# Patient Record
Sex: Male | Born: 1975 | Race: White | Hispanic: No | Marital: Married | Smoking: Current every day smoker
Health system: Southern US, Community
[De-identification: ages and names within clinical notes are randomized; demographics above are authoritative.]

## PROBLEM LIST (undated history)

## (undated) DIAGNOSIS — F319 Bipolar disorder, unspecified: Secondary | ICD-10-CM

## (undated) DIAGNOSIS — F32A Depression, unspecified: Secondary | ICD-10-CM

## (undated) DIAGNOSIS — I10 Essential (primary) hypertension: Secondary | ICD-10-CM

## (undated) DIAGNOSIS — G4730 Sleep apnea, unspecified: Secondary | ICD-10-CM

## (undated) DIAGNOSIS — E119 Type 2 diabetes mellitus without complications: Secondary | ICD-10-CM

## (undated) DIAGNOSIS — G43909 Migraine, unspecified, not intractable, without status migrainosus: Secondary | ICD-10-CM

## (undated) DIAGNOSIS — F419 Anxiety disorder, unspecified: Secondary | ICD-10-CM

## (undated) HISTORY — PX: MULTIPLE TOOTH EXTRACTIONS: SHX2053

---

## 2001-08-19 ENCOUNTER — Emergency Department (HOSPITAL_COMMUNITY): Admission: EM | Admit: 2001-08-19 | Discharge: 2001-08-19 | Payer: Self-pay | Admitting: Emergency Medicine

## 2003-02-17 ENCOUNTER — Ambulatory Visit (HOSPITAL_COMMUNITY): Admission: RE | Admit: 2003-02-17 | Discharge: 2003-02-17 | Payer: Self-pay | Admitting: Internal Medicine

## 2006-08-01 ENCOUNTER — Ambulatory Visit (HOSPITAL_COMMUNITY): Payer: Self-pay | Admitting: Psychiatry

## 2006-08-22 ENCOUNTER — Ambulatory Visit (HOSPITAL_COMMUNITY): Payer: Self-pay | Admitting: Psychiatry

## 2006-09-04 ENCOUNTER — Ambulatory Visit (HOSPITAL_COMMUNITY): Payer: Self-pay | Admitting: Psychology

## 2006-09-21 ENCOUNTER — Ambulatory Visit (HOSPITAL_COMMUNITY): Payer: Self-pay | Admitting: Psychiatry

## 2006-09-26 ENCOUNTER — Ambulatory Visit (HOSPITAL_COMMUNITY): Payer: Self-pay | Admitting: Psychology

## 2006-10-18 ENCOUNTER — Ambulatory Visit (HOSPITAL_COMMUNITY): Payer: Self-pay | Admitting: Psychology

## 2006-11-21 ENCOUNTER — Ambulatory Visit (HOSPITAL_COMMUNITY): Payer: Self-pay | Admitting: Psychiatry

## 2007-02-15 ENCOUNTER — Ambulatory Visit (HOSPITAL_COMMUNITY): Payer: Self-pay | Admitting: Psychiatry

## 2007-03-05 ENCOUNTER — Ambulatory Visit (HOSPITAL_COMMUNITY): Payer: Self-pay | Admitting: Psychology

## 2007-04-12 ENCOUNTER — Ambulatory Visit (HOSPITAL_COMMUNITY): Payer: Self-pay | Admitting: Psychiatry

## 2007-05-03 ENCOUNTER — Ambulatory Visit (HOSPITAL_COMMUNITY): Payer: Self-pay | Admitting: Psychiatry

## 2007-05-15 ENCOUNTER — Ambulatory Visit (HOSPITAL_COMMUNITY): Admission: RE | Admit: 2007-05-15 | Discharge: 2007-05-15 | Payer: Self-pay | Admitting: Internal Medicine

## 2007-08-16 ENCOUNTER — Ambulatory Visit (HOSPITAL_COMMUNITY): Payer: Self-pay | Admitting: Psychiatry

## 2008-02-17 ENCOUNTER — Emergency Department (HOSPITAL_COMMUNITY): Admission: EM | Admit: 2008-02-17 | Discharge: 2008-02-17 | Payer: Self-pay | Admitting: Emergency Medicine

## 2008-03-05 ENCOUNTER — Emergency Department (HOSPITAL_COMMUNITY): Admission: EM | Admit: 2008-03-05 | Discharge: 2008-03-05 | Payer: Self-pay | Admitting: Emergency Medicine

## 2016-02-06 ENCOUNTER — Emergency Department (HOSPITAL_COMMUNITY)
Admission: EM | Admit: 2016-02-06 | Discharge: 2016-02-06 | Disposition: A | Discharge status: Home / Self Care | Payer: Self-pay | Attending: Emergency Medicine | Admitting: Emergency Medicine

## 2016-02-06 ENCOUNTER — Encounter (HOSPITAL_COMMUNITY): Payer: Self-pay | Admitting: Emergency Medicine

## 2016-02-06 DIAGNOSIS — L02214 Cutaneous abscess of groin: Secondary | ICD-10-CM | POA: Insufficient documentation

## 2016-02-06 DIAGNOSIS — L0291 Cutaneous abscess, unspecified: Secondary | ICD-10-CM

## 2016-02-06 DIAGNOSIS — L03314 Cellulitis of groin: Secondary | ICD-10-CM | POA: Insufficient documentation

## 2016-02-06 HISTORY — DX: Migraine, unspecified, not intractable, without status migrainosus: G43.909

## 2016-02-06 LAB — CBC WITH DIFFERENTIAL/PLATELET
Basophils Absolute: 0 10*3/uL (ref 0.0–0.1)
Basophils Relative: 0 %
Eosinophils Absolute: 0.3 10*3/uL (ref 0.0–0.7)
Eosinophils Relative: 2 %
HCT: 44.4 % (ref 39.0–52.0)
Hemoglobin: 15.4 g/dL (ref 13.0–17.0)
Lymphocytes Relative: 14 %
Lymphs Abs: 2.3 10*3/uL (ref 0.7–4.0)
MCH: 31.2 pg (ref 26.0–34.0)
MCHC: 34.7 g/dL (ref 30.0–36.0)
MCV: 89.9 fL (ref 78.0–100.0)
Monocytes Absolute: 1 10*3/uL (ref 0.1–1.0)
Monocytes Relative: 6 %
Neutro Abs: 12.6 10*3/uL — ABNORMAL HIGH (ref 1.7–7.7)
Neutrophils Relative %: 78 %
Platelets: 259 10*3/uL (ref 150–400)
RBC: 4.94 MIL/uL (ref 4.22–5.81)
RDW: 12.9 % (ref 11.5–15.5)
WBC: 16.2 10*3/uL — ABNORMAL HIGH (ref 4.0–10.5)

## 2016-02-06 LAB — BASIC METABOLIC PANEL
Anion gap: 11 (ref 5–15)
BUN: 13 mg/dL (ref 6–20)
CO2: 23 mmol/L (ref 22–32)
Calcium: 10 mg/dL (ref 8.9–10.3)
Chloride: 101 mmol/L (ref 101–111)
Creatinine, Ser: 1.06 mg/dL (ref 0.61–1.24)
GFR calc Af Amer: >60 mL/min (ref >60)
GFR calc non Af Amer: >60 mL/min (ref >60)
Glucose, Bld: 102 mg/dL — ABNORMAL HIGH (ref 65–99)
Potassium: 3.1 mmol/L — ABNORMAL LOW (ref 3.5–5.1)
Sodium: 135 mmol/L (ref 135–145)

## 2016-02-06 MED ORDER — ONDANSETRON HCL 4 MG/2ML IJ SOLN
4.0000 mg | Freq: Once | INTRAMUSCULAR | Status: AC
  Administered 2016-02-06: 4 mg via INTRAVENOUS
  Filled 2016-02-06: qty 2

## 2016-02-06 MED ORDER — CEPHALEXIN 500 MG PO CAPS: 500.0000 mg | ORAL_CAPSULE | Freq: Four times a day (QID) | ORAL | 0 refills | Status: DC

## 2016-02-06 MED ORDER — MORPHINE SULFATE (PF) 4 MG/ML IV SOLN
4.0000 mg | Freq: Once | INTRAVENOUS | Status: AC
Start: 2016-02-06 — End: 2016-02-06
  Administered 2016-02-06: 4 mg via INTRAVENOUS
  Filled 2016-02-06: qty 1

## 2016-02-06 MED ORDER — CLINDAMYCIN PHOSPHATE 600 MG/50ML IV SOLN
600.0000 mg | Freq: Once | INTRAVENOUS | Status: AC
  Administered 2016-02-06: 600 mg via INTRAVENOUS
  Filled 2016-02-06: qty 50

## 2016-02-06 MED ORDER — HYDROCODONE-ACETAMINOPHEN 5-325 MG PO TABS: 1.0000 | ORAL_TABLET | ORAL | 0 refills | Status: DC | PRN

## 2016-02-06 MED ORDER — SULFAMETHOXAZOLE-TRIMETHOPRIM 800-160 MG PO TABS: 1.0000 | ORAL_TABLET | Freq: Two times a day (BID) | ORAL | 0 refills | Status: AC

## 2016-02-06 NOTE — ED Provider Notes (Signed)
[facility]-EMERGENCY DEPT Provider Note   CSN: [id number] Arrival date & time: 02/06/16  1609     History   Chief Complaint No chief complaint on file.   HPI Leonard Wiggins is a 40 y.o. male presenting with left groin swelling, pain and redness.  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 Since this event he has had increased pain, swelling and now spreading redness involving his lower abdomen and left thigh.  He denies nausea, vomiting, abdominal pain, fevers, chills or myalgias.  He has found no alleviators and has had no treatment prior to arrival.  The history is provided by the patient and the spouse.    Past Medical History:  Diagnosis Date  . Migraines     There are no active problems to display for this patient.   History reviewed. No pertinent surgical history.     Home Medications    Prior to Admission medications   Medication Sig Start Date End Date Taking? Authorizing Provider  cephALEXin (KEFLEX) 500 MG capsule Take 1 capsule (500 mg total) by mouth 4 (four) times daily. 02/06/16   Burgess AmorJulie Olyvia Gopal, PA-C  HYDROcodone-acetaminophen (NORCO/VICODIN) 5-325 MG tablet Take 1 tablet by mouth every 4 (four) hours as needed. 02/06/16   Burgess AmorJulie Carmalita Wakefield, PA-C  sulfamethoxazole-trimethoprim (BACTRIM DS,SEPTRA DS) 800-160 MG tablet Take 1 tablet by mouth 2 (two) times daily. 02/06/16 02/13/16  Burgess AmorJulie Esteban Kobashigawa, PA-C    Family History No family history on file.  Social History Social History  Substance Use Topics  . Smoking status: Not on file  . Smokeless tobacco: Not on file  . Alcohol use Not on file     Allergies   Patient has no allergy information on record.   Review of Systems Review of Systems  Constitutional: Negative for fever.  HENT: Negative for congestion and sore throat.   Eyes: Negative.   Respiratory: Negative for chest tightness and shortness of breath.   Cardiovascular:  Negative for chest pain.  Gastrointestinal: Negative for abdominal pain and nausea.  Genitourinary: Negative.   Musculoskeletal: Negative for arthralgias, joint swelling and neck pain.  Skin: Positive for color change and wound. Negative for rash.  Neurological: Negative for dizziness, weakness, light-headedness, numbness and headaches.  Psychiatric/Behavioral: Negative.      Physical Exam Updated Vital Signs BP 126/65 (BP Location: Right Arm)   Pulse 100   Temp 98.9 F (37.2 C) (Oral)   Resp 18   Ht 5\' 10"  (1.778 m)   Wt 81.6 kg   SpO2 99%   BMI 25.83 kg/m   Physical Exam  Constitutional: He appears well-developed and well-nourished.  HENT:  Head: Normocephalic and atraumatic.  Eyes: Conjunctivae are normal.  Neck: Normal range of motion.  Cardiovascular: Normal rate, regular rhythm, normal heart sounds and intact distal pulses.   Pulmonary/Chest: Effort normal and breath sounds normal. He has no wheezes.  Abdominal: Soft. Bowel sounds are normal. There is no tenderness.  Musculoskeletal: Normal range of motion.  Neurological: He is alert.  Skin: Skin is warm and dry. There is erythema.  Induration left groin and inguinal space/upper scrotum.  Does not involve the majority of the scrotal sack.  No drainage.  Surrounding erythema of left medial thigh and suprapubic area measuring 12 x 24 cm,  Sparing the groin crease.  No red streaking.  No perineum involvement.  Psychiatric: He has a normal  mood and affect.  Nursing note and vitals reviewed.    ED Treatments / Results  Labs (all labs ordered are listed, but only abnormal results are displayed) Labs Reviewed  CBC WITH DIFFERENTIAL/PLATELET - Abnormal; Notable for the following:       Result Value   WBC 16.2 (*)    Neutro Abs 12.6 (*)    All other components within normal limits  BASIC METABOLIC PANEL - Abnormal; Notable for the following:    Potassium 3.1 (*)    Glucose, Bld 102 (*)    All other components within  normal limits  AEROBIC/ANAEROBIC CULTURE (SURGICAL/DEEP WOUND)    EKG  EKG Interpretation None       Radiology No results found.  Procedures Procedures (including critical care time)  Apiration of blood/fluid Performed by: Burgess AmorIDOL, Grisela Mesch Consent obtained. Required items: required blood products, implants, devices, and special equipment available Patient identity confirmed: verbally with patient Time out: Immediately prior to procedure a "time out" was called to verify the correct patient, procedure, equipment, support staff and site/side marked as required. Preparation: Patient was prepped and draped in the usual sterile fashion. Patient tolerance: Patient tolerated the procedure well with no immediate complications.  Location of aspiration: left groin/scrotum.  Sterile technique used with betadine,  Freeze spray applied, 0.5 cc purulent/bloody aspirate obtained.       Medications Ordered in ED Medications  morphine 4 MG/ML injection 4 mg (4 mg Intravenous Given 02/06/16 1727)  ondansetron (ZOFRAN) injection 4 mg (4 mg Intravenous Given 02/06/16 1727)  clindamycin (CLEOCIN) IVPB 600 mg (0 mg Intravenous Stopped 02/06/16 1904)     Initial Impression / Assessment and Plan / ED Course  I have reviewed the triage vital signs and the nursing notes.  Pertinent labs & imaging results that were available during my care of the patient were reviewed by me and considered in my medical decision making (see chart for details).  Clinical Course     Abscess culture sent.  Labs reviewed and discussed with pt.  Leukocytosis.  Advised admission which pt refused.  He does agree to return tomorrow for a recheck.  IV clindamycin given, prescribed bactrim and keflex for home as he has no insurance, concern about affording clindamycin.  Hydrocodone.  Warm compresses.  Recheck here tomorrow.  Cellulitis area was outlined using skin marker.  Final Clinical Impressions(s) / ED Diagnoses    Final diagnoses:  Cellulitis of groin, left  Abscess    New Prescriptions Discharge Medication List as of 02/06/2016  6:59 PM    START taking these medications   Details  cephALEXin (KEFLEX) 500 MG capsule Take 1 capsule (500 mg total) by mouth 4 (four) times daily., Starting Sat 02/06/2016, Print    sulfamethoxazole-trimethoprim (BACTRIM DS,SEPTRA DS) 800-160 MG tablet Take 1 tablet by mouth 2 (two) times daily., Starting Sat 02/06/2016, Until Sat 02/13/2016, Print         Burgess AmorJulie Barron Vanloan, PA-C 02/06/16 1909    Samuel JesterKathleen McManus, DO 02/07/16 2215

## 2016-02-06 NOTE — ED Provider Notes (Signed)
EMERGENCY DEPARTMENT US SOFT TISSUE INTERPRETATION "Study: Limited Soft Tissue Ultrasound"  INDICATIONS: Pain and Soft tissue infection Multiple views of the body part were obtained in real-time with a multi-frequency linear probe PERFORMED BY:  Myself IMAGES ARCHIVED?: Yes SIDE:Left BODY PART:Pelvic wall FINDINGS: Abcess present and Cellulitis present INTERPRETATION:  Abcess present and Cellulitis present   CPT: Pelvic wall 40981-1976857-26   Marily MemosJason Paris [place], MD 02/06/16 1726

## 2016-02-06 NOTE — ED Triage Notes (Signed)
Pt states that he noticed the place on Thursday.  [place] [place] and thinks that he was bitten by a spider.  Pt states that he has an abscess on the left groin that red, swollen, and tender to the touch.

## 2016-02-06 NOTE — Discharge Instructions (Signed)
Take both antibiotics before bed tonight as your next doses.  You may take the hydrocodone for pain relief if needed - this will make you drowsy.  Do not drive within 4 hours of taking this medicine.  You may try a warm tub soak or warm compresses to the site also.  You are leaving against our advise as discussed as you would benefit from further IV antibiotics.  It is very important that you return here tomorrow for a recheck.

## 2016-02-06 NOTE — ED Notes (Signed)
MD at bedside. 

## 2016-02-13 LAB — AEROBIC/ANAEROBIC CULTURE (SURGICAL/DEEP WOUND): Special Requests: NORMAL

## 2016-02-13 LAB — AEROBIC/ANAEROBIC CULTURE W GRAM STAIN (SURGICAL/DEEP WOUND): Gram Stain: NONE SEEN

## 2016-02-14 ENCOUNTER — Telehealth (HOSPITAL_BASED_OUTPATIENT_CLINIC_OR_DEPARTMENT_OTHER): Payer: Self-pay

## 2016-02-14 NOTE — Telephone Encounter (Signed)
Post ED Visit - Positive Culture Follow-up  Culture report reviewed by antimicrobial stewardship pharmacist:  []  Enzo BiNathan Batchelder, Pharm.D. []  Celedonio MiyamotoJeremy Frens, Pharm.D., BCPS []  Garvin FilaMike Maccia, Pharm.D. []  Georgina PillionElizabeth Martin, Pharm.D., BCPS []  NiagaraMinh Pham, VermontPharm.D., BCPS, AAHIVP []  Estella HuskMichelle Turner, Pharm.D., BCPS, AAHIVP []  Tennis Mustassie Stewart, Pharm.D. []  Sherle Poeob Vincent, [address] Rainbow BoulevardPharm.D. [employer]ts[employer] Corporationllison masters Pharm D Positive Aerobic.Anaerobic culture Treated with Sulfamethoxazole-Trimethoprim, organism sensitive to the same and no further patient follow-up is required at this time.  Jerry CarasCullom, Setsuko Robins Burnett 02/14/2016, 10:02 AM

## 2021-08-12 ENCOUNTER — Encounter (HOSPITAL_COMMUNITY): Payer: Self-pay | Admitting: Emergency Medicine

## 2021-08-12 ENCOUNTER — Emergency Department (HOSPITAL_COMMUNITY)
Admission: EM | Admit: 2021-08-12 | Discharge: 2021-08-13 | Disposition: A | Discharge status: Home / Self Care | Payer: Medicaid Other | Attending: Emergency Medicine | Admitting: Emergency Medicine

## 2021-08-12 DIAGNOSIS — K2900 Acute gastritis without bleeding: Secondary | ICD-10-CM | POA: Insufficient documentation

## 2021-08-12 DIAGNOSIS — R112 Nausea with vomiting, unspecified: Secondary | ICD-10-CM | POA: Diagnosis present

## 2021-08-12 DIAGNOSIS — R197 Diarrhea, unspecified: Secondary | ICD-10-CM

## 2021-08-12 DIAGNOSIS — R1084 Generalized abdominal pain: Secondary | ICD-10-CM

## 2021-08-12 NOTE — ED Triage Notes (Signed)
Pt c/o N/V/D since Saturday. Pt seen at [facility] and prescribed 2 medications with no improvement.

## 2021-08-13 ENCOUNTER — Emergency Department (HOSPITAL_COMMUNITY): Payer: Medicaid Other

## 2021-08-13 LAB — COMPREHENSIVE METABOLIC PANEL
ALT: 27 U/L (ref 0–44)
AST: 23 U/L (ref 15–41)
Albumin: 4 g/dL (ref 3.5–5.0)
Alkaline Phosphatase: 108 U/L (ref 38–126)
Anion gap: 10 (ref 5–15)
BUN: 9 mg/dL (ref 6–20)
CO2: 18 mmol/L — ABNORMAL LOW (ref 22–32)
Calcium: 9.3 mg/dL (ref 8.9–10.3)
Chloride: 107 mmol/L (ref 98–111)
Creatinine, Ser: 1.24 mg/dL (ref 0.61–1.24)
GFR, Estimated: >60 mL/min (ref >60)
Glucose, Bld: 139 mg/dL — ABNORMAL HIGH (ref 70–99)
Potassium: 4 mmol/L (ref 3.5–5.1)
Sodium: 135 mmol/L (ref 135–145)
Total Bilirubin: 0.6 mg/dL (ref 0.3–1.2)
Total Protein: 7.4 g/dL (ref 6.5–8.1)

## 2021-08-13 LAB — CBC
HCT: 61.8 % — ABNORMAL HIGH (ref 39.0–52.0)
Hemoglobin: 19.8 g/dL — ABNORMAL HIGH (ref 13.0–17.0)
MCH: 30.7 pg (ref 26.0–34.0)
MCHC: 32 g/dL (ref 30.0–36.0)
MCV: 95.7 fL (ref 80.0–100.0)
Platelets: 125 10*3/uL — ABNORMAL LOW (ref 150–400)
RBC: 6.46 MIL/uL — ABNORMAL HIGH (ref 4.22–5.81)
RDW: 14.6 % (ref 11.5–15.5)
WBC: 16.1 10*3/uL — ABNORMAL HIGH (ref 4.0–10.5)
nRBC: 0 % (ref 0.0–0.2)

## 2021-08-13 LAB — LIPASE, BLOOD: Lipase: 19 U/L (ref 11–51)

## 2021-08-13 MED ORDER — ONDANSETRON HCL 4 MG/2ML IJ SOLN
4.0000 mg | Freq: Once | INTRAMUSCULAR | Status: AC
  Administered 2021-08-13: 4 mg via INTRAVENOUS
  Filled 2021-08-13: qty 2

## 2021-08-13 MED ORDER — ONDANSETRON 4 MG PO TBDP: ORAL_TABLET | ORAL | 0 refills | Status: AC

## 2021-08-13 MED ORDER — LOPERAMIDE HCL 2 MG PO CAPS: 2.0000 mg | ORAL_CAPSULE | Freq: Four times a day (QID) | ORAL | 0 refills | Status: AC | PRN

## 2021-08-13 MED ORDER — PANTOPRAZOLE SODIUM 40 MG PO TBEC: 40.0000 mg | DELAYED_RELEASE_TABLET | Freq: Every day | ORAL | 3 refills | Status: DC

## 2021-08-13 MED ORDER — SUCRALFATE 1 GM/10ML PO SUSP: 1.0000 g | Freq: Three times a day (TID) | ORAL | 0 refills | Status: AC

## 2021-08-13 MED ORDER — IOHEXOL 300 MG/ML  SOLN
100.0000 mL | Freq: Once | INTRAMUSCULAR | Status: AC | PRN
  Administered 2021-08-13: 100 mL via INTRAVENOUS

## 2021-08-13 MED ORDER — SODIUM CHLORIDE 0.9 % IV BOLUS
1000.0000 mL | Freq: Once | INTRAVENOUS | Status: AC
  Administered 2021-08-13: 1000 mL via INTRAVENOUS

## 2021-08-13 NOTE — ED Notes (Signed)
Patient transported to CT 

## 2021-08-13 NOTE — ED Provider Notes (Signed)
[facility] EMERGENCY DEPARTMENT Provider Note   CSN: [id number] Arrival date & time: 08/12/21  2302     History  Chief Complaint  Patient presents with   Emesis   Diarrhea    Leonard Wiggins is a 46 y.o. male.  Patient presents to the emergency department for evaluation of nausea, vomiting, diarrhea.  Patient reports that symptoms have been present for several days.  He also reports that this seems to happen on a somewhat regular schedule every 1 to 1-1/2 months.  He has been thinking it is related to something that he eats.  Patient reports diffuse upper abdominal pain associated with the symptoms.  He reports that he has a "sour stomach" with lots of foul-smelling belches.  He was seen at urgent care 4 days ago and was given prescription for his symptoms, but he ran out of the prescriptions and is still symptomatic.      Home Medications Prior to Admission medications   Medication Sig Start Date End Date Taking? Authorizing Provider  loperamide (IMODIUM) 2 MG capsule Take 1 capsule (2 mg total) by mouth 4 (four) times daily as needed for diarrhea or loose stools. 08/13/21  Yes Jazsmine Macari, Canary Brim, MD  ondansetron (ZOFRAN-ODT) 4 MG disintegrating tablet 4mg  ODT q4 hours prn nausea/vomit 08/13/21  Yes Tashon Capp, 08/15/21, MD  pantoprazole (PROTONIX) 40 MG tablet Take 1 tablet (40 mg total) by mouth daily. 08/13/21  Yes Shahida Schnackenberg, 08/15/21, MD  sucralfate (CARAFATE) 1 GM/10ML suspension Take 10 mLs (1 g total) by mouth 4 (four) times daily -  with meals and at bedtime. 08/13/21  Yes Amy Gothard, 08/15/21, MD  cephALEXin (KEFLEX) 500 MG capsule Take 1 capsule (500 mg total) by mouth 4 (four) times daily. 02/06/16   13/11/17, PA-C  HYDROcodone-acetaminophen (NORCO/VICODIN) 5-325 MG tablet Take 1 tablet by mouth every 4 (four) hours as needed. 02/06/16   13/11/17, PA-C      Allergies    Patient has no known allergies.    Review of Systems   Review of Systems  Physical  Exam Updated Vital Signs BP 120/87   Pulse (!) 125   Temp 98.1 F (36.7 C) (Oral)   Resp 18   Ht 6' (1.829 m)   Wt 95.3 kg   SpO2 97%   BMI 28.48 kg/m  Physical Exam Vitals and nursing note reviewed.  Constitutional:      General: He is not in acute distress.    Appearance: He is well-developed.  HENT:     Head: Normocephalic and atraumatic.     Mouth/Throat:     Mouth: Mucous membranes are moist.  Eyes:     General: Vision grossly intact. Gaze aligned appropriately.     Extraocular Movements: Extraocular movements intact.     Conjunctiva/sclera: Conjunctivae normal.  Cardiovascular:     Rate and Rhythm: Normal rate and regular rhythm.     Pulses: Normal pulses.     Heart sounds: Normal heart sounds, S1 normal and S2 normal. No murmur heard.   No friction rub. No gallop.  Pulmonary:     Effort: Pulmonary effort is normal. No respiratory distress.     Breath sounds: Normal breath sounds.  Abdominal:     Palpations: Abdomen is soft.     Tenderness: There is abdominal tenderness in the epigastric area. There is no guarding or rebound.     Hernia: No hernia is present.  Musculoskeletal:        General: No  swelling.     Cervical back: Full passive range of motion without pain, normal range of motion and neck supple. No pain with movement, spinous process tenderness or muscular tenderness. Normal range of motion.     Right lower leg: No edema.     Left lower leg: No edema.  Skin:    General: Skin is warm and dry.     Capillary Refill: Capillary refill takes less than 2 seconds.     Findings: No ecchymosis, erythema, lesion or wound.  Neurological:     Mental Status: He is alert and oriented to person, place, and time.     GCS: GCS eye subscore is 4. GCS verbal subscore is 5. GCS motor subscore is 6.     Cranial Nerves: Cranial nerves 2-12 are intact.     Sensory: Sensation is intact.     Motor: Motor function is intact. No weakness or abnormal muscle tone.      Coordination: Coordination is intact.  Psychiatric:        Mood and Affect: Mood normal.        Speech: Speech normal.        Behavior: Behavior normal.    ED Results / Procedures / Treatments   Labs (all labs ordered are listed, but only abnormal results are displayed) Labs Reviewed  COMPREHENSIVE METABOLIC PANEL - Abnormal; Notable for the following components:      Result Value   CO2 18 (*)    Glucose, Bld 139 (*)    All other components within normal limits  CBC - Abnormal; Notable for the following components:   WBC 16.1 (*)    RBC 6.46 (*)    Hemoglobin 19.8 (*)    HCT 61.8 (*)    Platelets 125 (*)    All other components within normal limits  LIPASE, BLOOD  URINALYSIS, ROUTINE W REFLEX MICROSCOPIC    EKG None  Radiology CT ABDOMEN PELVIS W CONTRAST  Result Date: 08/13/2021 CLINICAL DATA:  Abdominal pain, nausea/vomiting EXAM: CT ABDOMEN AND PELVIS WITH CONTRAST TECHNIQUE: Multidetector CT imaging of the abdomen and pelvis was performed using the standard protocol following bolus administration of intravenous contrast. RADIATION DOSE REDUCTION: This exam was performed according to the departmental dose-optimization program which includes automated exposure control, adjustment of the mA and/or kV according to patient size and/or use of iterative reconstruction technique. CONTRAST:  100mL OMNIPAQUE IOHEXOL 300 MG/ML  SOLN COMPARISON:  None Available. FINDINGS: Lower chest: Lung bases are clear. Hepatobiliary: 14 mm cyst versus hemangioma in the posterior right hepatic lobe (series 2/image 28). Additional subcentimeter probable cyst (series 2/image 47). Gallbladder is unremarkable. No intrahepatic or extrahepatic ductal dilatation. Pancreas: Within normal limits. Spleen: Within normal limits. Adrenals/Urinary Tract: Subcentimeter right adrenal cyst (series 2/image 23), benign. No follow-up imaging is recommended. Left adrenal gland is within normal limits. 2.4 cm left lower pole  renal cyst, benign. Right kidney is within normal limits. No hydronephrosis. Bladder is underdistended but unremarkable. Stomach/Bowel: Stomach is within normal limits. No evidence of bowel obstruction. Normal appendix (series 2/image 62). No colonic wall thickening or inflammatory changes. Vascular/Lymphatic: No evidence of abdominal aortic aneurysm. Mild atherosclerotic calcifications the abdominal aorta. No suspicious abdominopelvic lymphadenopathy. Reproductive: Prostate is unremarkable, noting dystrophic calcifications. Other: No abdominopelvic ascites. Musculoskeletal: Visualized osseous structures are within normal limits. IMPRESSION: No evidence of bowel obstruction. Normal appendix. No CT findings to account for the patient's abdominal pain. Additional ancillary findings as above. Electronically Signed   By: Charline BillsSriyesh  Krishnan  M.D.   On: 08/13/2021 01:24    Procedures Procedures    Medications Ordered in ED Medications  sodium chloride 0.9 % bolus 1,000 mL (1,000 mLs Intravenous New Bag/Given 08/13/21 0040)  ondansetron (ZOFRAN) injection 4 mg (4 mg Intravenous Given 08/13/21 0039)  iohexol (OMNIPAQUE) 300 MG/ML solution 100 mL (100 mLs Intravenous Contrast Given 08/13/21 0110)    ED Course/ Medical Decision Making/ A&P                           Medical Decision Making Problems Addressed: Acute gastritis without hemorrhage, unspecified gastritis type: acute illness or injury Generalized abdominal pain: acute illness or injury Nausea vomiting and diarrhea: acute illness or injury with systemic symptoms    Details: Dehydration resulting in tachycardia  Amount and/or Complexity of Data Reviewed Labs: ordered. Decision-making details documented in ED Course. Radiology: ordered and independent interpretation performed. Decision-making details documented in ED Course.  Risk Prescription drug management.   Presents to the emergency department for evaluation of abdominal pain.  Patient  has had nausea, vomiting and diarrhea.  He reports that this has been a recurrent problem recently.  Differential diagnosis considered includes GERD, gastritis, peptic ulcer disease, cholecystitis, pancreatitis, colitis, small bowel obstruction.  Examination reveals tachycardia.  Patient appears uncomfortable and is likely mildly dehydrated.  Administered IV fluids.  Lab work reveals a leukocytosis, remainder of blood work is unremarkable.  Normal lipase, doubt pancreatitis.  LFTs are normal.  No anemia to suggest significant GI bleeding.  Patient underwent CT scan that did not show any acute pathology.  We will treat patient symptomatically, add PPI, Carafate.  Patient will require follow-up with gastroenterology.  His vitals are stabilized and lab work relatively normal.  He does not require hospitalization at this time.        Final Clinical Impression(s) / ED Diagnoses Final diagnoses:  Nausea vomiting and diarrhea  Generalized abdominal pain  Acute gastritis without hemorrhage, unspecified gastritis type    Rx / DC Orders ED Discharge Orders          Ordered    pantoprazole (PROTONIX) 40 MG tablet  Daily        08/13/21 0135    sucralfate (CARAFATE) 1 GM/10ML suspension  3 times daily with meals & bedtime        08/13/21 0135    ondansetron (ZOFRAN-ODT) 4 MG disintegrating tablet        08/13/21 0135    loperamide (IMODIUM) 2 MG capsule  4 times daily PRN        08/13/21 0135              Gilda Crease, MD 08/13/21 0140

## 2021-10-03 ENCOUNTER — Encounter (HOSPITAL_COMMUNITY): Payer: Self-pay

## 2021-10-03 ENCOUNTER — Emergency Department (HOSPITAL_COMMUNITY)
Admission: EM | Admit: 2021-10-03 | Discharge: 2021-10-03 | Disposition: A | Discharge status: Home / Self Care | Payer: Medicaid Other | Attending: Emergency Medicine | Admitting: Emergency Medicine

## 2021-10-03 ENCOUNTER — Emergency Department (HOSPITAL_COMMUNITY): Payer: Medicaid Other

## 2021-10-03 ENCOUNTER — Other Ambulatory Visit: Payer: Self-pay

## 2021-10-03 DIAGNOSIS — R1013 Epigastric pain: Secondary | ICD-10-CM | POA: Diagnosis not present

## 2021-10-03 DIAGNOSIS — Z79899 Other long term (current) drug therapy: Secondary | ICD-10-CM | POA: Insufficient documentation

## 2021-10-03 DIAGNOSIS — R112 Nausea with vomiting, unspecified: Secondary | ICD-10-CM | POA: Diagnosis not present

## 2021-10-03 DIAGNOSIS — R079 Chest pain, unspecified: Secondary | ICD-10-CM | POA: Diagnosis present

## 2021-10-03 LAB — TROPONIN I (HIGH SENSITIVITY)
Troponin I (High Sensitivity): 2 ng/L (ref <18)
Troponin I (High Sensitivity): 2 ng/L (ref <18)

## 2021-10-03 LAB — CBC
HCT: 49.5 % (ref 39.0–52.0)
Hemoglobin: 16.6 g/dL (ref 13.0–17.0)
MCH: 30 pg (ref 26.0–34.0)
MCHC: 33.5 g/dL (ref 30.0–36.0)
MCV: 89.5 fL (ref 80.0–100.0)
Platelets: 312 10*3/uL (ref 150–400)
RBC: 5.53 MIL/uL (ref 4.22–5.81)
RDW: 13 % (ref 11.5–15.5)
WBC: 14 10*3/uL — ABNORMAL HIGH (ref 4.0–10.5)
nRBC: 0 % (ref 0.0–0.2)

## 2021-10-03 LAB — HEPATIC FUNCTION PANEL
ALT: 30 U/L (ref 0–44)
AST: 26 U/L (ref 15–41)
Albumin: 4.7 g/dL (ref 3.5–5.0)
Alkaline Phosphatase: 78 U/L (ref 38–126)
Bilirubin, Direct: 0.1 mg/dL (ref 0.0–0.2)
Indirect Bilirubin: 0.8 mg/dL (ref 0.3–0.9)
Total Bilirubin: 0.9 mg/dL (ref 0.3–1.2)
Total Protein: 8.2 g/dL — ABNORMAL HIGH (ref 6.5–8.1)

## 2021-10-03 LAB — BASIC METABOLIC PANEL
Anion gap: 10 (ref 5–15)
BUN: 11 mg/dL (ref 6–20)
CO2: 25 mmol/L (ref 22–32)
Calcium: 9.8 mg/dL (ref 8.9–10.3)
Chloride: 107 mmol/L (ref 98–111)
Creatinine, Ser: 1.01 mg/dL (ref 0.61–1.24)
GFR, Estimated: >60 mL/min (ref >60)
Glucose, Bld: 118 mg/dL — ABNORMAL HIGH (ref 70–99)
Potassium: 3.2 mmol/L — ABNORMAL LOW (ref 3.5–5.1)
Sodium: 142 mmol/L (ref 135–145)

## 2021-10-03 LAB — MAGNESIUM: Magnesium: 2 mg/dL (ref 1.7–2.4)

## 2021-10-03 LAB — CBG MONITORING, ED: Glucose-Capillary: 120 mg/dL — ABNORMAL HIGH (ref 70–99)

## 2021-10-03 LAB — LIPASE, BLOOD: Lipase: 24 U/L (ref 11–51)

## 2021-10-03 MED ORDER — ONDANSETRON HCL 4 MG/2ML IJ SOLN
4.0000 mg | Freq: Once | INTRAMUSCULAR | Status: AC
  Administered 2021-10-03: 4 mg via INTRAVENOUS
  Filled 2021-10-03: qty 2

## 2021-10-03 MED ORDER — IOHEXOL 350 MG/ML SOLN
100.0000 mL | Freq: Once | INTRAVENOUS | Status: AC | PRN
Start: 2021-10-03 — End: 2021-10-03
  Administered 2021-10-03: 100 mL via INTRAVENOUS

## 2021-10-03 MED ORDER — FAMOTIDINE IN NACL 20-0.9 MG/50ML-% IV SOLN
20.0000 mg | Freq: Once | INTRAVENOUS | Status: AC
  Administered 2021-10-03: 20 mg via INTRAVENOUS
  Filled 2021-10-03: qty 50

## 2021-10-03 MED ORDER — ALUM & MAG HYDROXIDE-SIMETH 200-200-20 MG/5ML PO SUSP
30.0000 mL | Freq: Once | ORAL | Status: AC
Start: 2021-10-03 — End: 2021-10-03
  Administered 2021-10-03: 30 mL via ORAL
  Filled 2021-10-03: qty 30

## 2021-10-03 MED ORDER — LACTATED RINGERS IV BOLUS
1000.0000 mL | Freq: Once | INTRAVENOUS | Status: AC
Start: 2021-10-03 — End: 2021-10-03
  Administered 2021-10-03: 1000 mL via INTRAVENOUS

## 2021-10-03 MED ORDER — POTASSIUM CHLORIDE 20 MEQ PO PACK
40.0000 meq | PACK | Freq: Once | ORAL | Status: AC
  Administered 2021-10-03: 40 meq via ORAL
  Filled 2021-10-03: qty 2

## 2021-10-03 MED ORDER — HYDROMORPHONE HCL 1 MG/ML IJ SOLN
1.0000 mg | Freq: Once | INTRAMUSCULAR | Status: AC
  Administered 2021-10-03: 1 mg via INTRAVENOUS
  Filled 2021-10-03: qty 1

## 2021-10-03 MED ORDER — LIDOCAINE VISCOUS HCL 2 % MT SOLN
15.0000 mL | Freq: Once | OROMUCOSAL | Status: AC
  Administered 2021-10-03: 15 mL via ORAL
  Filled 2021-10-03: qty 15

## 2021-10-03 NOTE — Discharge Instructions (Signed)
Keep your primary care doctor appointment for Tuesday.  When you see your primary care doctor, discuss long-term medications to help with your stomach upset and reflux.  Also discuss referral for a gastroenterologist for possible endoscopy of your esophagus and stomach.  Return to the emergency department at any time for any recurrence of severe symptoms.

## 2021-10-03 NOTE — ED Notes (Signed)
ED Provider at bedside. 

## 2021-10-03 NOTE — ED Notes (Signed)
Patient is more relaxed in bed and is no longer diaphoretic. Patient states he is starting to feel much better.

## 2021-10-03 NOTE — ED Triage Notes (Signed)
Pt presents to ED with complaints of mid chest pain, nausea, vomiting and diaphoresis since Friday.

## 2021-10-03 NOTE — ED Provider Notes (Signed)
[facility] EMERGENCY DEPARTMENT Provider Note   CSN: [id number] Arrival date & time: 10/03/21  1240     History  Chief Complaint  Patient presents with   Chest Pain    Leonard Wiggins is a 46 y.o. male.  HPI Patient presents for pain in chest, epigastrium, nausea, vomiting, diaphoresis.  Medical history includes recurrent episodes of similar symptoms.  Current episode of symptoms began 2 days ago.  Patient denies any associated shortness of breath.  Episodes of diaphoresis seem to occur with worsening of pain.  Pain is worsened with eating any foods.  Because of this, he has not eaten in the past 2 days.  He has been able to tolerate fluids.  Patient endorses current pain and lateral aspects of chest as well as epigastrium.  He also endorses current nausea.  He states he has not taken any antiemetics at home.  In the past, he has been prescribed Protonix and Carafate.  He is not taking these.  He has recently been using over-the-counter Pepcid.  He has no known cardiac history.  He does not drink alcohol.    Home Medications Prior to Admission medications   Medication Sig Start Date End Date Taking? Authorizing Provider  busPIRone (BUSPAR) 30 MG tablet Take 30 mg by mouth 2 (two) times daily.   Yes [provider]  citalopram (CELEXA) 40 MG tablet Take 40 mg by mouth daily.   Yes [provider]  clonazePAM (KLONOPIN) 1 MG tablet Take 1 mg by mouth 2 (two) times daily.   Yes [provider]  cloNIDine (CATAPRES) 0.2 MG tablet Take 0.2 mg by mouth 2 (two) times daily. 09/02/21  Yes [provider]  gabapentin (NEURONTIN) 400 MG capsule Take 400 mg by mouth 3 (three) times daily.   Yes [provider]  glipiZIDE (GLUCOTROL XL) 2.5 MG 24 hr tablet Take 2.5 mg by mouth daily with breakfast.   Yes [provider]  lamoTRIgine (LAMICTAL) 150 MG tablet Take 150 mg by mouth daily.   Yes [provider]  lisinopril (ZESTRIL) 5 MG tablet  Take 5 mg by mouth daily.   Yes [provider]  pantoprazole (PROTONIX) 40 MG tablet Take 1 tablet (40 mg total) by mouth daily. 08/13/21  Yes Pollina, Canary Brim, MD  loperamide (IMODIUM) 2 MG capsule Take 1 capsule (2 mg total) by mouth 4 (four) times daily as needed for diarrhea or loose stools. Patient not taking: Reported on 10/03/2021 08/13/21   Gilda Crease, MD  ondansetron (ZOFRAN-ODT) 4 MG disintegrating tablet 4mg  ODT q4 hours prn nausea/vomit Patient not taking: Reported on 10/03/2021 08/13/21   08/15/21, MD  sucralfate (CARAFATE) 1 GM/10ML suspension Take 10 mLs (1 g total) by mouth 4 (four) times daily -  with meals and at bedtime. Patient not taking: Reported on 10/03/2021 08/13/21   08/15/21, MD      Allergies    Patient has no known allergies.    Review of Systems   Review of Systems  Constitutional:  Positive for appetite change and diaphoresis.  Cardiovascular:  Positive for chest pain.  Gastrointestinal:  Positive for abdominal pain, nausea and vomiting.  All other systems reviewed and are negative.   Physical Exam Updated Vital Signs BP (!) 156/92   Pulse 91   Temp 98.9 F (37.2 C) (Oral)   Resp 16   Ht 6' (1.829 m)   Wt 99.8 kg   SpO2 98%   BMI  29.84 kg/m  Physical Exam Vitals and nursing note reviewed.  Constitutional:      General: He is not in acute distress.    Appearance: He is well-developed. He is ill-appearing. He is not toxic-appearing or diaphoretic.  HENT:     Head: Normocephalic and atraumatic.  Eyes:     Conjunctiva/sclera: Conjunctivae normal.  Neck:     Vascular: No JVD.  Cardiovascular:     Rate and Rhythm: Normal rate and regular rhythm.     Heart sounds: No murmur heard. Pulmonary:     Effort: Pulmonary effort is normal. Tachypnea present. No respiratory distress.     Breath sounds: Normal breath sounds. No decreased breath sounds, wheezing, rhonchi or rales.  Chest:     Chest wall:  Tenderness present.  Abdominal:     Palpations: Abdomen is soft.     Tenderness: There is abdominal tenderness.  Musculoskeletal:        General: No swelling.     Cervical back: Normal range of motion and neck supple.     Right lower leg: No edema.     Left lower leg: No edema.  Skin:    General: Skin is warm and dry.     Coloration: Skin is not cyanotic or pale.  Neurological:     General: No focal deficit present.     Mental Status: He is alert and oriented to person, place, and time.  Psychiatric:        Mood and Affect: Mood normal.        Behavior: Behavior normal.     ED Results / Procedures / Treatments   Labs (all labs ordered are listed, but only abnormal results are displayed) Labs Reviewed  BASIC METABOLIC PANEL - Abnormal; Notable for the following components:      Result Value   Potassium 3.2 (*)    Glucose, Bld 118 (*)    All other components within normal limits  CBC - Abnormal; Notable for the following components:   WBC 14.0 (*)    All other components within normal limits  HEPATIC FUNCTION PANEL - Abnormal; Notable for the following components:   Total Protein 8.2 (*)    All other components within normal limits  CBG MONITORING, ED - Abnormal; Notable for the following components:   Glucose-Capillary 120 (*)    All other components within normal limits  LIPASE, BLOOD  MAGNESIUM  TROPONIN I (HIGH SENSITIVITY)  TROPONIN I (HIGH SENSITIVITY)    EKG EKG Interpretation  Date/Time:  Sunday October 03 2021 12:48:59 EDT Ventricular Rate:  69 PR Interval:  130 QRS Duration: 95 QT Interval:  430 QTC Calculation: 461 R Axis:   69 Text Interpretation: Sinus rhythm Baseline wander in lead(s) II aVF V1 Confirmed by Gloris Manchester ([id number]) on 10/03/2021 1:14:12 PM  Radiology CT Angio Chest PE W and/or Wo Contrast  Result Date: 10/03/2021 CLINICAL DATA:  Pulmonary embolism (PE) suspected, high prob; Nausea/vomiting Abdominal pain, acute, nonlocalized EXAM: CT  ANGIOGRAPHY CHEST CT ABDOMEN AND PELVIS WITH CONTRAST TECHNIQUE: Multidetector CT imaging of the chest was performed using the standard protocol during bolus administration of intravenous contrast. Multiplanar CT image reconstructions and MIPs were obtained to evaluate the vascular anatomy. Multidetector CT imaging of the abdomen and pelvis was performed using the standard protocol during bolus administration of intravenous contrast. RADIATION DOSE REDUCTION: This exam was performed according to the departmental dose-optimization program which includes automated exposure control, adjustment of the mA and/or kV according to patient size  and/or use of iterative reconstruction technique. CONTRAST:  OMNIPAQUE IOHEXOL 350 MG/ML SOLN COMPARISON:  CT abdomen pelvis 08/13/2021, CT a 826 2022 FINDINGS: CTA CHEST FINDINGS Cardiovascular: Satisfactory opacification of the pulmonary arteries to the segmental level. No evidence of pulmonary embolism. Normal cardiac size.No pericardial disease. Mild coronary artery atherosclerosis. The thoracic aorta is unremarkable. Mediastinum/Nodes: No lymphadenopathy. The thyroid is unremarkable. Esophagus is unremarkable. Lungs/Pleura: No focal airspace disease. No pleural effusion. No pneumothorax. No suspicious pulmonary nodules. Musculoskeletal: No acute osseous abnormality. No suspicious osseous lesion. Chronic right lateral seventh rib deformity. Chronic left posterior eighth rib deformity. Unchanged anterior physiologic wedging of T11. Review of the MIP images confirms the above findings. CT ABDOMEN and PELVIS FINDINGS Hepatobiliary: Posterior right hepatic lobe cyst versus hemangioma is poorly visualized on today's exam, likely secondary to differences in contrast timing. No new liver lesions. The gallbladder is unremarkable. Pancreas: No pancreatic ductal dilatation or surrounding inflammatory changes. Spleen: Normal in size without focal abnormality. Adrenals/Urinary Tract:  There is a tiny right adrenal cyst or myelolipoma measuring 6 mm, unchanged, no follow-up imaging is recommended. Adrenal gland is normal. No hydronephrosis or nephrolithiasis. Unchanged bilateral renal cysts. No follow-up imaging is recommended. The bladder is moderately distended. Stomach/Bowel: The stomach is within normal limits. There is no evidence of bowel obstruction.The appendix is normal. The colon is decompressed. Vascular/Lymphatic: Scattered atherosclerosis.  No lymphadenopathy. Reproductive: Prostate calcifications. Other: No abdominal wall hernia or abnormality. No abdominopelvic ascites. Musculoskeletal: No acute osseous abnormality. No suspicious osseous lesion. Review of the MIP images confirms the above findings. IMPRESSION: No pulmonary embolism or other acute findings in the chest. No acute findings in the abdomen or pelvis. Normal appendix. No bowel obstruction. Electronically Signed   By: Caprice Renshaw M.D.   On: 10/03/2021 14:59   DG Chest Portable 1 View  Result Date: 10/03/2021 CLINICAL DATA:  PT complaining of mid chest pain, nausea, vomiting and diaphoresis since Friday EXAM: PORTABLE CHEST 1 VIEW COMPARISON:  08/11/2021. FINDINGS: Cardiac silhouette is normal in size. Normal mediastinal and hilar contours. Clear lungs.  No pleural effusion or pneumothorax. Skeletal structures are grossly intact. IMPRESSION: No active disease. Electronically Signed   By: Amie Portland M.D.   On: 10/03/2021 13:43    Procedures Procedures    Medications Ordered in ED Medications  lactated ringers bolus 1,000 mL (0 mLs Intravenous Stopped 10/03/21 1456)  ondansetron (ZOFRAN) injection 4 mg (4 mg Intravenous Given 10/03/21 1302)  HYDROmorphone (DILAUDID) injection 1 mg (1 mg Intravenous Given 10/03/21 1306)  famotidine (PEPCID) IVPB 20 mg premix (0 mg Intravenous Stopped 10/03/21 1456)  alum & mag hydroxide-simeth (MAALOX/MYLANTA) 200-200-20 MG/5ML suspension 30 mL (30 mLs Oral Given 10/03/21 1344)    And   lidocaine (XYLOCAINE) 2 % viscous mouth solution 15 mL (15 mLs Oral Given 10/03/21 1344)  iohexol (OMNIPAQUE) 350 MG/ML injection 100 mL (100 mLs Intravenous Contrast Given 10/03/21 1426)  potassium chloride (KLOR-CON) packet 40 mEq (40 mEq Oral Given 10/03/21 1521)    ED Course/ Medical Decision Making/ A&P                           Medical Decision Making Amount and/or Complexity of Data Reviewed Labs: ordered. Radiology: ordered.  Risk OTC drugs. Prescription drug management.   This patient presents to the ED for concern of chest pain, abdominal pain, nausea, and vomiting, this involves an extensive number of treatment options, and is a complaint that carries with  it a high risk of complications and morbidity.  The differential diagnosis includes gastritis, PUD, GERD, ACS, pneumonia, PE   Co morbidities that complicate the patient evaluation  Episodes of recurrent vomiting, gastritis   Additional history obtained:  Additional history obtained from N/A External records from outside source obtained and reviewed including EMR   Lab Tests:  I Ordered, and personally interpreted labs.  The pertinent results include: Mild hypokalemia with otherwise normal electrolytes, leukocytosis is present.  Hemoglobin is normal.  Troponins are normal x2   Imaging Studies ordered:  I ordered imaging studies including CTA chest, CT of abdomen pelvis I independently visualized and interpreted imaging which showed no acute findings I agree with the radiologist interpretation   Cardiac Monitoring: / EKG:  The patient was maintained on a cardiac monitor.  I personally viewed and interpreted the cardiac monitored which showed an underlying rhythm of: Sinus rhythm  Problem List / ED Course / Critical interventions / Medication management  Patient presenting for symptoms of epigastric pain, lateral chest pain, nausea, vomiting, and poor p.o. tolerance over the past 2 days.  Vital signs are  normal upon arrival.  Patient appears uncomfortable on exam.  He does have chest and epigastric tenderness.  Lungs are clear to auscultation.  Although endorses recent episode of diaphoresis, this is not present currently.  Given his poor p.o. tolerance, IV fluids were ordered.  Patient was given Dilaudid and Zofran for symptomatic relief.  Laboratory work-up was initiated.  On reassessment, patient reports improved symptoms.  I did review EMR and patient has had multiple ED visits for similar symptoms that, in the past, have been attributed to gastritis.  Patient was given GI cocktail for further relief of symptoms.  Laboratory work-up was notable for mild hypokalemia.  Replacement potassium was ordered.  Patient underwent imaging of chest, abdomen, and pelvis.  Results of the CT scan showed no acute findings.  Patient had continued improvement in symptoms while in the ED.  He is now tolerating p.o. intake.  He denies any further discomfort.  Given negative imaging and significantly improved symptoms following GI cocktail I suspect that symptoms are secondary to gastritis.  Patient currently has a follow-up appointment scheduled with his primary care doctor 2 days from now.  He was advised that he would likely benefit from long-term PPI therapy.  He would also benefit from GI follow-up for possible endoscopic.  Patient currently receives his primary care in [place].  He stated that he would prefer to go to his primary care doctor for all long-term medications and specialist referrals.  Given his resolved symptoms, patient is appropriate for discharge at this time. I ordered medication including IV fluids for dehydration; Zofran for nausea; Dilaudid, GI cocktail for analgesia; potassium chloride for hypokalemia Reevaluation of the patient after these medicines showed that the patient resolved I have reviewed the patients home medicines and have made adjustments as needed   Social Determinants of  Health:  Has PCP and has a scheduled appointment in 2 days         Final Clinical Impression(s) / ED Diagnoses Final diagnoses:  Chest pain, unspecified type  Nausea and vomiting, unspecified vomiting type  Epigastric pain    Rx / DC Orders ED Discharge Orders     None         Gloris Manchester, MD 10/03/21 1530

## 2021-10-03 NOTE — ED Notes (Signed)
Patient is diaphoretic upon assessment with obvious beads of sweat on forehead.

## 2021-10-04 ENCOUNTER — Emergency Department (HOSPITAL_COMMUNITY)
Admission: EM | Admit: 2021-10-04 | Discharge: 2021-10-04 | Disposition: A | Discharge status: Home / Self Care | Payer: Medicaid Other | Attending: Emergency Medicine | Admitting: Emergency Medicine

## 2021-10-04 ENCOUNTER — Encounter (HOSPITAL_COMMUNITY): Payer: Self-pay

## 2021-10-04 DIAGNOSIS — K224 Dyskinesia of esophagus: Secondary | ICD-10-CM | POA: Insufficient documentation

## 2021-10-04 DIAGNOSIS — R079 Chest pain, unspecified: Secondary | ICD-10-CM | POA: Diagnosis not present

## 2021-10-04 DIAGNOSIS — R110 Nausea: Secondary | ICD-10-CM | POA: Diagnosis present

## 2021-10-04 DIAGNOSIS — K2900 Acute gastritis without bleeding: Secondary | ICD-10-CM | POA: Diagnosis not present

## 2021-10-04 HISTORY — DX: Essential (primary) hypertension: I10

## 2021-10-04 HISTORY — DX: Type 2 diabetes mellitus without complications: E11.9

## 2021-10-04 MED ORDER — ALUM & MAG HYDROXIDE-SIMETH 200-200-20 MG/5ML PO SUSP
30.0000 mL | Freq: Once | ORAL | Status: AC
  Administered 2021-10-04: 30 mL via ORAL
  Filled 2021-10-04: qty 30

## 2021-10-04 MED ORDER — LIDOCAINE VISCOUS HCL 2 % MT SOLN
15.0000 mL | Freq: Once | OROMUCOSAL | Status: AC
  Administered 2021-10-04: 15 mL via OROMUCOSAL
  Filled 2021-10-04: qty 15

## 2021-10-04 MED ORDER — DIPHENHYDRAMINE HCL 25 MG PO CAPS
50.0000 mg | ORAL_CAPSULE | Freq: Once | ORAL | Status: AC
Start: 2021-10-04 — End: 2021-10-04
  Administered 2021-10-04: 50 mg via ORAL
  Filled 2021-10-04: qty 2

## 2021-10-04 MED ORDER — HALOPERIDOL LACTATE 5 MG/ML IJ SOLN
5.0000 mg | Freq: Once | INTRAMUSCULAR | Status: AC
  Administered 2021-10-04: 5 mg via INTRAMUSCULAR
  Filled 2021-10-04: qty 1

## 2021-10-04 MED ORDER — OXYCODONE-ACETAMINOPHEN 5-325 MG PO TABS
1.0000 | ORAL_TABLET | Freq: Once | ORAL | Status: AC
  Administered 2021-10-04: 1 via ORAL
  Filled 2021-10-04: qty 1

## 2021-10-04 MED ORDER — HYOSCYAMINE SULFATE 0.125 MG SL SUBL
0.2500 mg | SUBLINGUAL_TABLET | Freq: Once | SUBLINGUAL | Status: AC
  Administered 2021-10-04: 0.25 mg via SUBLINGUAL
  Filled 2021-10-04: qty 2

## 2021-10-04 NOTE — ED Triage Notes (Signed)
Pt c/o centralized chest pain and nausea, states he was seen here yesterday for the same and was treated for reflux. States the pain came back around 2200 last night.

## 2021-10-04 NOTE — ED Notes (Signed)
ED Provider at bedside. 

## 2021-10-04 NOTE — ED Provider Notes (Signed)
[facility] EMERGENCY DEPARTMENT Provider Note   CSN: [id number] Arrival date & time: 10/04/21  0134     History  Chief Complaint  Patient presents with   Chest Pain    Leonard Wiggins is a 46 y.o. male.  Patient returns with chest pain and nausea.  Patient was seen earlier in the department, had a work-up and discharged.  It was thought that his symptoms were GI in nature.  Patient reports that after he left he had recurrence of his symptoms.  He is complaining of diffuse pain across the chest with nausea.       Home Medications Prior to Admission medications   Medication Sig Start Date End Date Taking? Authorizing Provider  busPIRone (BUSPAR) 30 MG tablet Take 30 mg by mouth 2 (two) times daily.    [provider]  citalopram (CELEXA) 40 MG tablet Take 40 mg by mouth daily.    [provider]  clonazePAM (KLONOPIN) 1 MG tablet Take 1 mg by mouth 2 (two) times daily.    [provider]  cloNIDine (CATAPRES) 0.2 MG tablet Take 0.2 mg by mouth 2 (two) times daily. 09/02/21   [provider]  gabapentin (NEURONTIN) 400 MG capsule Take 400 mg by mouth 3 (three) times daily.    [provider]  glipiZIDE (GLUCOTROL XL) 2.5 MG 24 hr tablet Take 2.5 mg by mouth daily with breakfast.    [provider]  lamoTRIgine (LAMICTAL) 150 MG tablet Take 150 mg by mouth daily.    [provider]  lisinopril (ZESTRIL) 5 MG tablet Take 5 mg by mouth daily.    [provider]  loperamide (IMODIUM) 2 MG capsule Take 1 capsule (2 mg total) by mouth 4 (four) times daily as needed for diarrhea or loose stools. Patient not taking: Reported on 10/03/2021 08/13/21   Gilda Crease, MD  ondansetron (ZOFRAN-ODT) 4 MG disintegrating tablet 4mg  ODT q4 hours prn nausea/vomit Patient not taking: Reported on 10/03/2021 08/13/21   08/15/21, MD  pantoprazole (PROTONIX) 40 MG tablet Take 1 tablet (40 mg total) by mouth daily.  08/13/21   Srinika Delone, 08/15/21, MD  sucralfate (CARAFATE) 1 GM/10ML suspension Take 10 mLs (1 g total) by mouth 4 (four) times daily -  with meals and at bedtime. Patient not taking: Reported on 10/03/2021 08/13/21   08/15/21, MD      Allergies    Patient has no known allergies.    Review of Systems   Review of Systems  Physical Exam Updated Vital Signs BP (!) 155/91   Pulse 83   Temp 98.1 F (36.7 C) (Oral)   Resp 19   Ht 6' (1.829 m)   Wt 99.7 kg   SpO2 100%   BMI 29.81 kg/m  Physical Exam Vitals and nursing note reviewed.  Constitutional:      General: He is not in acute distress.    Appearance: He is well-developed.  HENT:     Head: Normocephalic and atraumatic.     Mouth/Throat:     Mouth: Mucous membranes are moist.  Eyes:     General: Vision grossly intact. Gaze aligned appropriately.     Extraocular Movements: Extraocular movements intact.     Conjunctiva/sclera: Conjunctivae normal.  Cardiovascular:     Rate and Rhythm: Normal rate and regular rhythm.     Pulses: Normal pulses.     Heart sounds: Normal heart sounds, S1 normal and S2 normal. No murmur heard.  No friction rub. No gallop.  Pulmonary:     Effort: Pulmonary effort is normal. No respiratory distress.     Breath sounds: Normal breath sounds.  Abdominal:     Palpations: Abdomen is soft.     Tenderness: There is abdominal tenderness in the right upper quadrant, epigastric area and left upper quadrant. There is no guarding or rebound.     Hernia: No hernia is present.  Musculoskeletal:        General: No swelling.     Cervical back: Full passive range of motion without pain, normal range of motion and neck supple. No pain with movement, spinous process tenderness or muscular tenderness. Normal range of motion.     Right lower leg: No edema.     Left lower leg: No edema.  Skin:    General: Skin is warm and dry.     Capillary Refill: Capillary refill takes less than 2 seconds.      Findings: No ecchymosis, erythema, lesion or wound.  Neurological:     Mental Status: He is alert and oriented to person, place, and time.     GCS: GCS eye subscore is 4. GCS verbal subscore is 5. GCS motor subscore is 6.     Cranial Nerves: Cranial nerves 2-12 are intact.     Sensory: Sensation is intact.     Motor: Motor function is intact. No weakness or abnormal muscle tone.     Coordination: Coordination is intact.  Psychiatric:        Mood and Affect: Mood normal.        Speech: Speech normal.        Behavior: Behavior normal.     ED Results / Procedures / Treatments   Labs (all labs ordered are listed, but only abnormal results are displayed) Labs Reviewed - No data to display  EKG EKG Interpretation  Date/Time:  Monday October 04 2021 01:47:03 EDT Ventricular Rate:  75 PR Interval:  137 QRS Duration: 105 QT Interval:  426 QTC Calculation: 476 R Axis:   69 Text Interpretation: Sinus rhythm Borderline low voltage, extremity leads Borderline prolonged QT interval Confirmed by Gilda Crease [phone]) on 10/04/2021 1:52:36 AM  Radiology CT Angio Chest PE W and/or Wo Contrast  Result Date: 10/03/2021 CLINICAL DATA:  Pulmonary embolism (PE) suspected, high prob; Nausea/vomiting Abdominal pain, acute, nonlocalized EXAM: CT ANGIOGRAPHY CHEST CT ABDOMEN AND PELVIS WITH CONTRAST TECHNIQUE: Multidetector CT imaging of the chest was performed using the standard protocol during bolus administration of intravenous contrast. Multiplanar CT image reconstructions and MIPs were obtained to evaluate the vascular anatomy. Multidetector CT imaging of the abdomen and pelvis was performed using the standard protocol during bolus administration of intravenous contrast. RADIATION DOSE REDUCTION: This exam was performed according to the departmental dose-optimization program which includes automated exposure control, adjustment of the mA and/or kV according to patient size and/or use of iterative  reconstruction technique. CONTRAST:  OMNIPAQUE IOHEXOL 350 MG/ML SOLN COMPARISON:  CT abdomen pelvis 08/13/2021, CT a 826 2022 FINDINGS: CTA CHEST FINDINGS Cardiovascular: Satisfactory opacification of the pulmonary arteries to the segmental level. No evidence of pulmonary embolism. Normal cardiac size.No pericardial disease. Mild coronary artery atherosclerosis. The thoracic aorta is unremarkable. Mediastinum/Nodes: No lymphadenopathy. The thyroid is unremarkable. Esophagus is unremarkable. Lungs/Pleura: No focal airspace disease. No pleural effusion. No pneumothorax. No suspicious pulmonary nodules. Musculoskeletal: No acute osseous abnormality. No suspicious osseous lesion. Chronic right lateral seventh rib deformity. Chronic left posterior eighth rib deformity. Unchanged  anterior physiologic wedging of T11. Review of the MIP images confirms the above findings. CT ABDOMEN and PELVIS FINDINGS Hepatobiliary: Posterior right hepatic lobe cyst versus hemangioma is poorly visualized on today's exam, likely secondary to differences in contrast timing. No new liver lesions. The gallbladder is unremarkable. Pancreas: No pancreatic ductal dilatation or surrounding inflammatory changes. Spleen: Normal in size without focal abnormality. Adrenals/Urinary Tract: There is a tiny right adrenal cyst or myelolipoma measuring 6 mm, unchanged, no follow-up imaging is recommended. Adrenal gland is normal. No hydronephrosis or nephrolithiasis. Unchanged bilateral renal cysts. No follow-up imaging is recommended. The bladder is moderately distended. Stomach/Bowel: The stomach is within normal limits. There is no evidence of bowel obstruction.The appendix is normal. The colon is decompressed. Vascular/Lymphatic: Scattered atherosclerosis.  No lymphadenopathy. Reproductive: Prostate calcifications. Other: No abdominal wall hernia or abnormality. No abdominopelvic ascites. Musculoskeletal: No acute osseous abnormality. No  suspicious osseous lesion. Review of the MIP images confirms the above findings. IMPRESSION: No pulmonary embolism or other acute findings in the chest. No acute findings in the abdomen or pelvis. Normal appendix. No bowel obstruction. Electronically Signed   By: Caprice Renshaw M.D.   On: 10/03/2021 14:59   CT ABDOMEN PELVIS W CONTRAST  Result Date: 10/03/2021 CLINICAL DATA:  Pulmonary embolism (PE) suspected, high prob; Nausea/vomiting Abdominal pain, acute, nonlocalized EXAM: CT ANGIOGRAPHY CHEST CT ABDOMEN AND PELVIS WITH CONTRAST TECHNIQUE: Multidetector CT imaging of the chest was performed using the standard protocol during bolus administration of intravenous contrast. Multiplanar CT image reconstructions and MIPs were obtained to evaluate the vascular anatomy. Multidetector CT imaging of the abdomen and pelvis was performed using the standard protocol during bolus administration of intravenous contrast. RADIATION DOSE REDUCTION: This exam was performed according to the departmental dose-optimization program which includes automated exposure control, adjustment of the mA and/or kV according to patient size and/or use of iterative reconstruction technique. CONTRAST:  OMNIPAQUE IOHEXOL 350 MG/ML SOLN COMPARISON:  CT abdomen pelvis 08/13/2021, CT a 826 2022 FINDINGS: CTA CHEST FINDINGS Cardiovascular: Satisfactory opacification of the pulmonary arteries to the segmental level. No evidence of pulmonary embolism. Normal cardiac size.No pericardial disease. Mild coronary artery atherosclerosis. The thoracic aorta is unremarkable. Mediastinum/Nodes: No lymphadenopathy. The thyroid is unremarkable. Esophagus is unremarkable. Lungs/Pleura: No focal airspace disease. No pleural effusion. No pneumothorax. No suspicious pulmonary nodules. Musculoskeletal: No acute osseous abnormality. No suspicious osseous lesion. Chronic right lateral seventh rib deformity. Chronic left posterior eighth rib deformity. Unchanged  anterior physiologic wedging of T11. Review of the MIP images confirms the above findings. CT ABDOMEN and PELVIS FINDINGS Hepatobiliary: Posterior right hepatic lobe cyst versus hemangioma is poorly visualized on today's exam, likely secondary to differences in contrast timing. No new liver lesions. The gallbladder is unremarkable. Pancreas: No pancreatic ductal dilatation or surrounding inflammatory changes. Spleen: Normal in size without focal abnormality. Adrenals/Urinary Tract: There is a tiny right adrenal cyst or myelolipoma measuring 6 mm, unchanged, no follow-up imaging is recommended. Adrenal gland is normal. No hydronephrosis or nephrolithiasis. Unchanged bilateral renal cysts. No follow-up imaging is recommended. The bladder is moderately distended. Stomach/Bowel: The stomach is within normal limits. There is no evidence of bowel obstruction.The appendix is normal. The colon is decompressed. Vascular/Lymphatic: Scattered atherosclerosis.  No lymphadenopathy. Reproductive: Prostate calcifications. Other: No abdominal wall hernia or abnormality. No abdominopelvic ascites. Musculoskeletal: No acute osseous abnormality. No suspicious osseous lesion. Review of the MIP images confirms the above findings. IMPRESSION: No pulmonary embolism or other acute findings in the chest. No acute  findings in the abdomen or pelvis. Normal appendix. No bowel obstruction. Electronically Signed   By: Caprice Renshaw M.D.   On: 10/03/2021 14:59   DG Chest Portable 1 View  Result Date: 10/03/2021 CLINICAL DATA:  PT complaining of mid chest pain, nausea, vomiting and diaphoresis since Friday EXAM: PORTABLE CHEST 1 VIEW COMPARISON:  08/11/2021. FINDINGS: Cardiac silhouette is normal in size. Normal mediastinal and hilar contours. Clear lungs.  No pleural effusion or pneumothorax. Skeletal structures are grossly intact. IMPRESSION: No active disease. Electronically Signed   By: Amie Portland M.D.   On: 10/03/2021 13:43     Procedures Procedures    Medications Ordered in ED Medications  hyoscyamine (LEVSIN SL) SL tablet 0.25 mg (has no administration in time range)  alum & mag hydroxide-simeth (MAALOX/MYLANTA) 200-200-20 MG/5ML suspension 30 mL (has no administration in time range)  lidocaine (XYLOCAINE) 2 % viscous mouth solution 15 mL (has no administration in time range)  haloperidol lactate (HALDOL) injection 5 mg (has no administration in time range)  diphenhydrAMINE (BENADRYL) capsule 50 mg (has no administration in time range)  oxyCODONE-acetaminophen (PERCOCET/ROXICET) 5-325 MG per tablet 1 tablet (has no administration in time range)    ED Course/ Medical Decision Making/ A&P                           Medical Decision Making Risk OTC drugs. Prescription drug management.   Returns with continued chest pain, abdominal pain, nausea.  I reviewed his records from previous visit.  Patient had extensive work-up that was unremarkable.  I cannot think of any other testing that would be necessary to further evaluate this patient.  I suspect that his diagnosis of gastritis is correct, possibly with some esophagitis and/or esophageal spasm.  Discussed with patient that I will provide further treatment of his symptoms but he needs to follow-up with his primary care and GI as an outpatient.        Final Clinical Impression(s) / ED Diagnoses Final diagnoses:  Acute gastritis without hemorrhage, unspecified gastritis type  Esophageal spasm    Rx / DC Orders ED Discharge Orders     None         Gilda Crease, MD 10/04/21 [phone]

## 2021-10-06 ENCOUNTER — Ambulatory Visit: Payer: Medicaid Other | Admitting: Gastroenterology

## 2022-07-22 ENCOUNTER — Emergency Department (HOSPITAL_COMMUNITY)
Admission: EM | Admit: 2022-07-22 | Discharge: 2022-07-22 | Disposition: A | Discharge status: Home / Self Care | Payer: Self-pay | Attending: Emergency Medicine | Admitting: Emergency Medicine

## 2022-07-22 ENCOUNTER — Other Ambulatory Visit: Payer: Self-pay

## 2022-07-22 ENCOUNTER — Encounter (HOSPITAL_COMMUNITY): Payer: Self-pay | Admitting: Emergency Medicine

## 2022-07-22 ENCOUNTER — Emergency Department (HOSPITAL_COMMUNITY): Payer: Self-pay

## 2022-07-22 DIAGNOSIS — I10 Essential (primary) hypertension: Secondary | ICD-10-CM | POA: Insufficient documentation

## 2022-07-22 DIAGNOSIS — Z7984 Long term (current) use of oral hypoglycemic drugs: Secondary | ICD-10-CM | POA: Insufficient documentation

## 2022-07-22 DIAGNOSIS — E119 Type 2 diabetes mellitus without complications: Secondary | ICD-10-CM | POA: Insufficient documentation

## 2022-07-22 DIAGNOSIS — M722 Plantar fascial fibromatosis: Secondary | ICD-10-CM | POA: Insufficient documentation

## 2022-07-22 DIAGNOSIS — Z79899 Other long term (current) drug therapy: Secondary | ICD-10-CM | POA: Insufficient documentation

## 2022-07-22 MED ORDER — MELOXICAM 15 MG PO TABS: 15.0000 mg | ORAL_TABLET | Freq: Every day | ORAL | 0 refills | Status: AC

## 2022-07-22 MED ORDER — KETOROLAC TROMETHAMINE 60 MG/2ML IM SOLN
60.0000 mg | Freq: Once | INTRAMUSCULAR | Status: AC
  Administered 2022-07-22: 60 mg via INTRAMUSCULAR
  Filled 2022-07-22: qty 2

## 2022-07-22 NOTE — ED Triage Notes (Signed)
Pt arrives POV from home c/o pain in the arch of his right foot for the past 2 months. Pt states he heard a pop in the same area yesterday and the pain has increased.

## 2022-07-22 NOTE — Discharge Instructions (Signed)
Ultimately this type of problem can take several months to heal, I would recommend that he take an anti-inflammatory, I have prescribed meloxicam which she can take once a day as needed   Please take Mobic, 15 mg once a day as needed for pain - this in an antiinflammatory medicine (NSAID) and is similar to ibuprofen - many people feel that it is stronger than ibuprofen and it is easier to take since it is a smaller pill.  Please use this only for 1 week - if your pain persists, you will need to follow up with your doctor in the office for ongoing guidance and pain control.  I would recommend applying ice and using crutches, follow-up with your family doctor as well as the podiatrist which I have mentioned with their phone number above.

## 2022-07-22 NOTE — ED Provider Notes (Signed)
[place] EMERGENCY DEPARTMENT AT [facility] Provider Note   CSN: [id number] Arrival date & time: 07/22/22  0410     History  Chief Complaint  Patient presents with   Foot Pain    Leonard Wiggins is a 47 y.o. male.   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  He has hypertension and describes that over the last couple of months he has had pain in the arch of his right foot which is consistent with a plantar fasciitis though he had not seen a physician nor had a diagnosis.  Last night while he was walking and putting pain and stress on the foot he heard a pop then some swelling in the arch of his foot and has had pain since that time.     Home Medications Prior to Admission medications   Medication Sig Start Date End Date Taking? Authorizing Provider  meloxicam (MOBIC) 15 MG tablet Take 1 tablet (15 mg total) by mouth daily for 14 days. 07/22/22 08/05/22 Yes Eber Hong, MD  busPIRone (BUSPAR) 30 MG tablet Take 30 mg by mouth 2 (two) times daily.    [provider]  citalopram (CELEXA) 40 MG tablet Take 40 mg by mouth daily.    [provider]  clonazePAM (KLONOPIN) 1 MG tablet Take 1 mg by mouth 2 (two) times daily.    [provider]  cloNIDine (CATAPRES) 0.2 MG tablet Take 0.2 mg by mouth 2 (two) times daily. 09/02/21   [provider]  gabapentin (NEURONTIN) 400 MG capsule Take 400 mg by mouth 3 (three) times daily.    [provider]  glipiZIDE (GLUCOTROL XL) 2.5 MG 24 hr tablet Take 2.5 mg by mouth daily with breakfast.    [provider]  lamoTRIgine (LAMICTAL) 150 MG tablet Take 150 mg by mouth daily.    [provider]  lisinopril (ZESTRIL) 5 MG tablet Take 5 mg by mouth daily.    [provider]  loperamide (IMODIUM) 2 MG capsule Take 1 capsule (2 mg total) by mouth 4 (four) times daily as  needed for diarrhea or loose stools. Patient not taking: Reported on 10/03/2021 08/13/21   Gilda Crease, MD  ondansetron (ZOFRAN-ODT) 4 MG disintegrating tablet 4mg  ODT q4 hours prn nausea/vomit Patient not taking: Reported on 10/03/2021 08/13/21   Gilda Crease, MD  pantoprazole (PROTONIX) 40 MG tablet Take 1 tablet (40 mg total) by mouth daily. 08/13/21   Pollina, Canary Brim, MD  sucralfate (CARAFATE) 1 GM/10ML suspension Take 10 mLs (1 g total) by mouth 4 (four) times daily -  with meals and at bedtime. Patient not taking: Reported on 10/03/2021 08/13/21   Gilda Crease, MD      Allergies    Patient has no known allergies.    Review of Systems   Review of Systems  Musculoskeletal:  Positive for arthralgias and gait problem.  Neurological:  Negative for weakness and numbness.    Physical Exam Updated Vital Signs BP 135/83 (BP Location: Right Arm)   Pulse (!) 114   Temp 98.9 F (37.2 C) (Oral)   Resp 18   Ht 1.829 m (6')   Wt 108.9 kg   SpO2 97%   BMI 32.55 kg/m  Physical Exam Vitals and nursing note reviewed.  Constitutional:      Appearance: He is well-developed. He is not diaphoretic.  HENT:  Head: Normocephalic and atraumatic.  Eyes:     General:        Right eye: No discharge.        Left eye: No discharge.     Conjunctiva/sclera: Conjunctivae normal.  Pulmonary:     Effort: Pulmonary effort is normal. No respiratory distress.  Musculoskeletal:     Comments: Tenderness and swelling in the arch of the right foot more proximally, no tenderness around the bony structures of the foot or the ankle, normal range of motion of the ankle.  All other joints and extremities with supple joints and soft compartments.  Normal pulses at the right foot  Skin:    General: Skin is warm and dry.     Findings: No erythema or rash.  Neurological:     Mental Status: He is alert.     Coordination: Coordination normal.     ED Results / Procedures /  Treatments   Labs (all labs ordered are listed, but only abnormal results are displayed) Labs Reviewed - No data to display  EKG None  Radiology DG Foot Complete Right  Result Date: 07/22/2022 CLINICAL DATA:  Foot pain at the arch for 2 months. EXAM: RIGHT FOOT COMPLETE - 3+ VIEW COMPARISON:  None Available. FINDINGS: There is no evidence of fracture or dislocation. No degenerative joint narrowing or spurring. Tiny heel spur. IMPRESSION: No acute finding. Small heel spur. Electronically Signed   By: Tiburcio Pea M.D.   On: 07/22/2022 05:19    Procedures Procedures    Medications Ordered in ED Medications  ketorolac (TORADOL) injection 60 mg (has no administration in time range)    ED Course/ Medical Decision Making/ A&P                             Medical Decision Making Amount and/or Complexity of Data Reviewed Radiology: ordered.  Risk Prescription drug management.   This patient presents to the ED for concern of foot pain and swelling differential diagnosis includes fracture, plantar fasciitis with possible partial rupture    Additional history obtained:  Additional history obtained from medical record External records from outside source obtained and reviewed including no recent visits   Lab Tests:  I Ordered, and personally interpreted labs.  The pertinent results include: Not indicated   Imaging Studies ordered:  I ordered imaging studies including x-ray of the foot I independently visualized and interpreted imaging which showed no fractures or dislocations, there is a heel spur I agree with the radiologist interpretation   Medicines ordered and prescription drug management:  I ordered medication including ketorolac for pain Reevaluation of the patient after these medicines showed that the patient improved I have reviewed the patients home medicines and have made adjustments as needed   Problem List / ED Course:  Anticipate plantar fasciitis  with partial rupture, will refer to podiatry, give crutches and anti-inflammatories, patient agreeable, avoid steroids given diabetes   Social Determinants of Health:  Obese, diabetic           Final Clinical Impression(s) / ED Diagnoses Final diagnoses:  Plantar fasciitis    Rx / DC Orders ED Discharge Orders          Ordered    meloxicam (MOBIC) 15 MG tablet  Daily        07/22/22 0728              Eber Hong, MD 07/22/22 0730

## 2022-09-07 DIAGNOSIS — Z79899 Other long term (current) drug therapy: Secondary | ICD-10-CM | POA: Diagnosis not present

## 2022-09-07 DIAGNOSIS — F411 Generalized anxiety disorder: Secondary | ICD-10-CM | POA: Diagnosis not present

## 2022-09-07 DIAGNOSIS — F410 Panic disorder [episodic paroxysmal anxiety] without agoraphobia: Secondary | ICD-10-CM | POA: Diagnosis not present

## 2022-09-07 DIAGNOSIS — F3181 Bipolar II disorder: Secondary | ICD-10-CM | POA: Diagnosis not present

## 2022-12-23 ENCOUNTER — Emergency Department (HOSPITAL_COMMUNITY)
Admission: EM | Admit: 2022-12-23 | Discharge: 2022-12-23 | Discharge status: Against Medical Advice | Payer: BC Managed Care – PPO | Source: Home / Self Care

## 2023-01-05 DIAGNOSIS — G629 Polyneuropathy, unspecified: Secondary | ICD-10-CM | POA: Diagnosis not present

## 2023-01-05 DIAGNOSIS — Z79899 Other long term (current) drug therapy: Secondary | ICD-10-CM | POA: Diagnosis not present

## 2023-01-05 DIAGNOSIS — F3181 Bipolar II disorder: Secondary | ICD-10-CM | POA: Diagnosis not present

## 2023-01-05 DIAGNOSIS — F411 Generalized anxiety disorder: Secondary | ICD-10-CM | POA: Diagnosis not present

## 2023-01-05 DIAGNOSIS — G2581 Restless legs syndrome: Secondary | ICD-10-CM | POA: Diagnosis not present

## 2023-01-05 DIAGNOSIS — E119 Type 2 diabetes mellitus without complications: Secondary | ICD-10-CM | POA: Diagnosis not present

## 2023-01-05 DIAGNOSIS — F410 Panic disorder [episodic paroxysmal anxiety] without agoraphobia: Secondary | ICD-10-CM | POA: Diagnosis not present

## 2023-01-05 DIAGNOSIS — I10 Essential (primary) hypertension: Secondary | ICD-10-CM | POA: Diagnosis not present

## 2023-01-18 DIAGNOSIS — F17200 Nicotine dependence, unspecified, uncomplicated: Secondary | ICD-10-CM | POA: Diagnosis not present

## 2023-01-18 DIAGNOSIS — R1110 Vomiting, unspecified: Secondary | ICD-10-CM | POA: Diagnosis not present

## 2023-01-18 DIAGNOSIS — R1013 Epigastric pain: Secondary | ICD-10-CM | POA: Diagnosis not present

## 2023-01-18 DIAGNOSIS — K209 Esophagitis, unspecified without bleeding: Secondary | ICD-10-CM | POA: Diagnosis not present

## 2023-01-18 DIAGNOSIS — R160 Hepatomegaly, not elsewhere classified: Secondary | ICD-10-CM | POA: Diagnosis not present

## 2023-01-18 DIAGNOSIS — R93421 Abnormal radiologic findings on diagnostic imaging of right kidney: Secondary | ICD-10-CM | POA: Diagnosis not present

## 2023-01-18 DIAGNOSIS — R112 Nausea with vomiting, unspecified: Secondary | ICD-10-CM | POA: Diagnosis not present

## 2023-01-18 DIAGNOSIS — E119 Type 2 diabetes mellitus without complications: Secondary | ICD-10-CM | POA: Diagnosis not present

## 2023-01-18 DIAGNOSIS — I10 Essential (primary) hypertension: Secondary | ICD-10-CM | POA: Diagnosis not present

## 2023-01-18 DIAGNOSIS — R0789 Other chest pain: Secondary | ICD-10-CM | POA: Diagnosis not present

## 2023-01-18 DIAGNOSIS — R1011 Right upper quadrant pain: Secondary | ICD-10-CM | POA: Diagnosis not present

## 2023-01-18 DIAGNOSIS — Z5329 Procedure and treatment not carried out because of patient's decision for other reasons: Secondary | ICD-10-CM | POA: Diagnosis not present

## 2023-01-18 DIAGNOSIS — R109 Unspecified abdominal pain: Secondary | ICD-10-CM | POA: Diagnosis not present

## 2023-01-18 DIAGNOSIS — K6389 Other specified diseases of intestine: Secondary | ICD-10-CM | POA: Diagnosis not present

## 2023-01-18 DIAGNOSIS — K760 Fatty (change of) liver, not elsewhere classified: Secondary | ICD-10-CM | POA: Diagnosis not present

## 2023-01-19 DIAGNOSIS — R109 Unspecified abdominal pain: Secondary | ICD-10-CM | POA: Diagnosis not present

## 2023-01-20 DIAGNOSIS — Z1330 Encounter for screening examination for mental health and behavioral disorders, unspecified: Secondary | ICD-10-CM | POA: Diagnosis not present

## 2023-01-20 DIAGNOSIS — R1013 Epigastric pain: Secondary | ICD-10-CM | POA: Diagnosis not present

## 2023-01-20 DIAGNOSIS — D751 Secondary polycythemia: Secondary | ICD-10-CM | POA: Diagnosis not present

## 2023-01-20 DIAGNOSIS — E278 Other specified disorders of adrenal gland: Secondary | ICD-10-CM | POA: Diagnosis not present

## 2023-01-20 DIAGNOSIS — R7303 Prediabetes: Secondary | ICD-10-CM | POA: Diagnosis not present

## 2023-01-20 DIAGNOSIS — K760 Fatty (change of) liver, not elsewhere classified: Secondary | ICD-10-CM | POA: Diagnosis not present

## 2023-01-20 DIAGNOSIS — R110 Nausea: Secondary | ICD-10-CM | POA: Diagnosis not present

## 2023-02-06 ENCOUNTER — Ambulatory Visit (INDEPENDENT_AMBULATORY_CARE_PROVIDER_SITE_OTHER): Payer: BC Managed Care – PPO | Admitting: Gastroenterology

## 2023-02-06 ENCOUNTER — Encounter: Payer: Self-pay | Admitting: Gastroenterology

## 2023-02-06 ENCOUNTER — Telehealth: Payer: Self-pay | Admitting: *Deleted

## 2023-02-06 ENCOUNTER — Other Ambulatory Visit: Payer: Self-pay | Admitting: *Deleted

## 2023-02-06 ENCOUNTER — Encounter: Payer: Self-pay | Admitting: *Deleted

## 2023-02-06 VITALS — BP 122/79 | HR 94 | Temp 97.6°F | Ht 72.0 in | Wt 235.0 lb

## 2023-02-06 DIAGNOSIS — K219 Gastro-esophageal reflux disease without esophagitis: Secondary | ICD-10-CM | POA: Diagnosis not present

## 2023-02-06 DIAGNOSIS — R112 Nausea with vomiting, unspecified: Secondary | ICD-10-CM

## 2023-02-06 DIAGNOSIS — R1013 Epigastric pain: Secondary | ICD-10-CM | POA: Diagnosis not present

## 2023-02-06 DIAGNOSIS — K7581 Nonalcoholic steatohepatitis (NASH): Secondary | ICD-10-CM

## 2023-02-06 DIAGNOSIS — Z1211 Encounter for screening for malignant neoplasm of colon: Secondary | ICD-10-CM

## 2023-02-06 DIAGNOSIS — R7989 Other specified abnormal findings of blood chemistry: Secondary | ICD-10-CM

## 2023-02-06 MED ORDER — PEG 3350-KCL-NA BICARB-NACL 420 G PO SOLR: 4000.0000 mL | Freq: Once | ORAL | 0 refills | Status: AC

## 2023-02-06 MED ORDER — PANTOPRAZOLE SODIUM 40 MG PO TBEC
40.0000 mg | DELAYED_RELEASE_TABLET | Freq: Every day | ORAL | 3 refills | Status: DC
Start: 2023-02-06 — End: 2023-03-13

## 2023-02-06 NOTE — Patient Instructions (Addendum)
Follow a GERD diet:  Avoid fried, fatty, greasy, spicy, citrus foods. Avoid caffeine and carbonated beverages. Avoid chocolate. Try eating 4-6 small meals a day rather than 3 large meals. Do not eat within 3 hours of laying down. Prop head of bed up on wood or bricks to create a 6 inch incline.  As we discussed is important for you to avoid all ibuprofen, Aleve, Advil, BC and Goody powders.  Instructions for fatty liver: Recommend 1-2# weight loss per week until ideal body weight through exercise & diet. Low fat/cholesterol diet.   Avoid sweets, sodas, fruit juices, sweetened beverages like tea, etc. Gradually increase exercise from 15 min daily up to 1 hr per day 5 days/week. Limit alcohol use.  We are scheduling you for colonoscopy and upper endoscopy in the near future with Dr. Marletta Lor.   You may stop taking over the counter acid reflux medication and start pantoprazole 40 mg daily, 30 minutes prior to breakfast.  You can continue to take the sucralfate up to 3 times a day as needed.  I will see you in 3-4 months for follow-up after your procedures.  I be of a wonderful Thanksgiving and Christmas.  Was a pleasure to meet you today.  I want to create trusting relationships with patients. If you receive a survey regarding your visit,  I greatly appreciate you taking time to fill this out on paper or through your MyChart. I value your feedback.  Brooke Bonito, MSN, FNP-BC, AGACNP-BC [facility]

## 2023-02-06 NOTE — Progress Notes (Signed)
GI Office Note    Referring Provider: Roe Rutherford, NP Primary Care Physician:  System, Provider Not In  Primary Gastroenterologist: Hennie Duos. Marletta Lor, DO  Chief Complaint   Chief Complaint  Patient presents with   Endoscopy    Screening for upper endoscopy    History of Present Illness   Leonard Wiggins is a 47 y.o. male presenting today at the request of Roe Rutherford, NP for Epigastric pain.   Seen at [facility] ED on 01/18/2023 with 2 days of epigastric pain after being out of Prilosec.  He reported that he was recently diagnosed with esophagitis/gastritis and a possible ulcer and awaiting blood work from Dr.  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  States he is a [employer].  Labs unremarkable.  CT A/P with multicystic appearance of right adrenal gland hypertrophy of bilateral adrenal glands with fatty infiltration noted adjacent to the colon which could be sequela of IBD?,  Hepatic steatosis.  RUQ [place] with diffuse increased echogenicity of the liver consistent with hepatic steatosis and hepatomegaly.  1.6 cm hypoechoic focus adjacent to the gallbladder fossa favor focal area of hepatic sparing or hepatic cyst.  Echogenic focus in the right kidney favoring nonobstructing stone.  Normal CBD and gallbladder.  Patient left prior to being officially discharged.  Seen by PCP 01/20/2023.  Provided GI cocktail.  Previous cardiac workup has included a normal EKG and cardiac labs.  He admitted to using Goody powders often for migraines and never had a GI workup.  H. pylori breath test ordered, given Toradol injection, Rolm Baptise ordered as well as CBC and CMP.  Prescribed Protonix to take twice daily.  Labs 01/20/2023: A1c 6.1.  Normal TSH and PTH.  UA with slight hematuria.  Lipase 10.  WBC 12.6, hemoglobin 16.8.  Alk phos 135, AST 41, ALT 69, T. bili 0.8.  NASH FibroSure with if 0, S2-S3 moderate steatosis,  into moderate NASH.  GGT 108.   Today: Epigastric pain lasting a few months. Has extreme nausea and vomiting. Has been unable to eat much and hard to keep liquids down as well. Almost immediately vomits after eating. He has been taking carafate three times a day and has not had any problems and taking otc heartburn medication. Sometimes eats breakfast and sometimes he does not. He is unsure what triggers the pain. He does admit to anxiety, depression, and bipolar. He does suspect that possible pizza and t[employer] could trigger.   He states PCP gave him instructions for how to make a GI cocktail at home.   Reports issues with migraines - on Ubrelvy (second dose helps) but he does not feel like it helps him.used to be on Imitrex that worked zero. Used to take aleve, Advil, ibuprofen, tylenol. Admitted to goody powders. Toradol has helped in the past. He has reported hx MRI in the past. He states he gets pale with migraines.   Has been sober for 6.5 years, previous hx drug abuse.   Has only reported 2 episodes of heartburn since then because he ran out.   Last A1c 6.1 and has been off glipizide. Metformin has made him very sick in the past.   Wt Readings from Last 3 Encounters:  02/06/23 235 lb (106.6 kg)  07/22/22 240 lb (108.9 kg)  10/04/21 219 lb 12.8 oz (99.7 kg)    Current Outpatient Medications  Medication Sig Dispense Refill   busPIRone (  BUSPAR) 30 MG tablet Take 30 mg by mouth 2 (two) times daily.     citalopram (CELEXA) 40 MG tablet Take 40 mg by mouth daily.     clonazePAM (KLONOPIN) 1 MG tablet Take 1 mg by mouth 2 (two) times daily.     gabapentin (NEURONTIN) 400 MG capsule Take 400 mg by mouth 3 (three) times daily.     lamoTRIgine (LAMICTAL) 150 MG tablet Take 150 mg by mouth daily.     lisinopril (ZESTRIL) 5 MG tablet Take 5 mg by mouth daily.     pantoprazole (PROTONIX) 40 MG tablet Take 1 tablet (40 mg total) by mouth daily. 90 tablet 3   rOPINIRole (REQUIP) 1 MG tablet  Take by mouth.     cloNIDine (CATAPRES) 0.2 MG tablet Take 0.2 mg by mouth 2 (two) times daily. (Patient not taking: Reported on 02/06/2023)     glipiZIDE (GLUCOTROL XL) 2.5 MG 24 hr tablet Take 2.5 mg by mouth daily with breakfast. (Patient not taking: Reported on 02/06/2023)     loperamide (IMODIUM) 2 MG capsule Take 1 capsule (2 mg total) by mouth 4 (four) times daily as needed for diarrhea or loose stools. (Patient not taking: Reported on 10/03/2021) 12 capsule 0   ondansetron (ZOFRAN-ODT) 4 MG disintegrating tablet 4mg  ODT q4 hours prn nausea/vomit (Patient not taking: Reported on 10/03/2021) 10 tablet 0   sucralfate (CARAFATE) 1 GM/10ML suspension Take 10 mLs (1 g total) by mouth 4 (four) times daily -  with meals and at bedtime. (Patient not taking: Reported on 10/03/2021) 420 mL 0   No current facility-administered medications for this visit.    Past Medical History:  Diagnosis Date   Diabetes mellitus without complication (HCC)    Hypertension    Migraines     History reviewed. No pertinent surgical history.  Family History  Problem Relation Age of Onset   Bladder Cancer Father    Colon polyps Neg Hx    Cancer - Colon Neg Hx     Allergies as of 02/06/2023   (No Known Allergies)    Social History   Socioeconomic History   Marital status: Married    Spouse name: Not on file   Number of children: Not on file   Years of education: Not on file   Highest education level: Not on file  Occupational History   Not on file  Tobacco Use   Smoking status: Every Day    Current packs/day: 1.00    Types: Cigarettes   Smokeless tobacco: Never  Substance and Sexual Activity   Alcohol use: Not Currently   Drug use: Not Currently   Sexual activity: Not on file  Other Topics Concern   Not on file  Social History Narrative   Not on file   Social Determinants of Health   Financial Resource Strain: Low Risk  (01/20/2023)   Received from [facility]   Overall Financial Resource  Strain (CARDIA)    Difficulty of Paying Living Expenses: Not hard at all  Food Insecurity: No Food Insecurity (01/20/2023)   Received from [facility]   Hunger Vital Sign    Worried About Running Out of Food in the Last Year: Never true    Ran Out of Food in the Last Year: Never true  Transportation Needs: No Transportation Needs (01/20/2023)   Received from [facility] - Transportation    Lack of Transportation (Medical): No    Lack of Transportation (Non-Medical): No  Physical  Activity: Not on file  Stress: Not on file  Social Connections: Unknown (01/18/2023)   Received from [facility]   Social Network    Social Network: Not on file  Intimate Partner Violence: Unknown (01/18/2023)   Received from [facility]   HITS    Physically Hurt: Not on file    Insult or Talk Down To: Not on file    Threaten Physical Harm: Not on file    Scream or Curse: Not on file     Review of Systems   Gen: Denies any fever, chills, fatigue, weight loss, lack of appetite.  CV: Denies chest pain, heart palpitations, peripheral edema, syncope.  Resp: Denies shortness of breath at rest or with exertion. Denies wheezing or cough.  GI: see HPI GU : Denies urinary burning, urinary frequency, urinary hesitancy MS: + Joint and muscle pain, restless leg. Denies muscle weakness, or limitation of movement.  Derm: Denies rash, itching, dry skin Psych: + Anxiety/depression.  Denies memory loss, and confusion Heme: Denies bruising, bleeding, and enlarged lymph nodes.  Physical Exam   BP 122/79 (BP Location: Right Arm, Patient Position: Sitting, Cuff Size: Large)   Pulse 94   Temp 97.6 F (36.4 C) (Temporal)   Ht 6' (1.829 m)   Wt 235 lb (106.6 kg)   BMI 31.87 kg/m   General:   Alert and oriented. Pleasant and cooperative. Well-nourished and well-developed.  Head:  Normocephalic and atraumatic. Eyes:  Without icterus, sclera clear and conjunctiva pink.  Ears:  Normal auditory  acuity. Mouth:  No deformity or lesions, oral mucosa pink.  Lungs:  Clear to auscultation bilaterally. No wheezes, rales, or rhonchi. No distress.  Heart:  S1, S2 present without murmurs appreciated.  Abdomen:  +BS, soft, non-tender and non-distended. No HSM noted. No guarding or rebound. No masses appreciated.  Rectal:  Deferred  Msk:  Symmetrical without gross deformities. Normal posture. Extremities:  Without edema. Neurologic:  Alert and  oriented x4;  grossly normal neurologically. Skin:  Intact without significant lesions or rashes. Psych:  Alert and cooperative. Normal mood and affect.  Assessment   Leonard Wiggins is a 47 y.o. male with a history of anxiety/depression, bipolar, HTN, diabetes, restless leg, migraines, tobacco use presenting today with epigastric pain.   Epigastric pain, N/V: Symptoms consistent with possible peptic ulcer disease, esophagitis, and/or gastritis.  Unable to rule out H. pylori as well.  Since taking Carafate along with over-the-counter acid reflux medication he has had significant improvement of pain and nausea/vomiting and able to tolerate diet now.  Advised that we should increase his PPI to standard dosing for now and he can use Carafate as needed.  Discussed GERD diet and complete cessation of all NSAIDs and aspirin powders as he is admitted to frequent Goody powder use given migraines.  MASH: Elevated LFTs. RUQ [place] and CT imaging findings consistent with steatosis.  Discussed fatty liver diet today.  Will continue to monitor and if any point he experiences increase in fibrosis we may submit for Rezdiffra.  Screening for colon cancer: Denies any family history of colon cancer or colon polyps.  Has never had a colonoscopy.  No alarm symptoms present.  Will proceed with first-ever screening colonoscopy at same time as upper endoscopy.  PLAN   Fatty liver diet Stop otc omeprazole and start Pantoprazole 40 mg daily. Carafate 1g TID as needed  GERD  diet Complete cessation of NSAIDs and ASA powders Limit alcohol use Proceed with upper endoscopy and colonoscopy  with propofol by Dr. Marletta Lor in near future: the risks, benefits, and alternatives have been discussed with the patient in detail. The patient states understanding and desires to proceed. ASA 3 Follow up in 3-4 months   Brooke Bonito, MSN, FNP-BC, AGACNP-BC [facility]

## 2023-02-06 NOTE — Telephone Encounter (Signed)
Carelon PA for EGD: Order ID: [id number]       Authorized  Approval Valid Through: 02/06/2023 - 05/06/2023

## 2023-02-28 ENCOUNTER — Telehealth: Payer: Self-pay | Admitting: *Deleted

## 2023-02-28 NOTE — Telephone Encounter (Signed)
Pt left vm having questions about upcoming procedure and has misplaced his papers.Leonard Wiggins   LMTRC

## 2023-03-01 ENCOUNTER — Encounter: Payer: Self-pay | Admitting: *Deleted

## 2023-03-01 ENCOUNTER — Other Ambulatory Visit: Payer: Self-pay | Admitting: *Deleted

## 2023-03-01 MED ORDER — SUTAB 1479-225-188 MG PO TABS: 12.0000 | ORAL_TABLET | ORAL | 0 refills | Status: AC

## 2023-03-01 NOTE — Telephone Encounter (Signed)
Pt called back and stated he has trouble swallowing liquid medications. He states there would be no way he could do the prep that was sent in. He said he heard about "some pills" he could take to help clean him out. Advised pt that insurance doesn't pay for them and would cost around $140. He says that is fine to send them in and he will pay for them. Updated instructions printed and placed up front for pt to come by and pick up

## 2023-03-06 ENCOUNTER — Other Ambulatory Visit (HOSPITAL_COMMUNITY): Payer: BC Managed Care – PPO

## 2023-03-09 NOTE — Patient Instructions (Signed)
Leonard Wiggins  03/09/2023     @PREFPERIOPPHARMACY @   Your procedure is scheduled on  03/13/2023.   [facility] to [facility] at  1130  A.M.   Call this number if you have problems the morning of surgery:  [phone]  If you experience any cold or flu symptoms such as cough, fever, chills, shortness of breath, etc. between now and your scheduled surgery, please notify us at the above number.   Remember:  Follow the diet and prep instructions givrn to you by the office.    You may drink clear liquids until 0930 am on 03/13/2023.    Clear liquids allowed are:                    Water, Juice (No red color; non-citric and without pulp; diabetics please choose diet or no sugar options), Carbonated beverages (diabetics please choose diet or no sugar options), Clear Tea (No creamer, milk, or cream, including half & half and powdered creamer), Black Coffee Only (No creamer, milk or cream, including half & half and powdered creamer), and Clear Sports drink (No red color; diabetics please choose diet or no sugar options)    Take these medicines the morning of surgery with A SIP OF WATER            buspirone, citalopram, clonazepam, gabapentin, lamicatal, pantoprazole.     Do not wear jewelry, make-up or nail polish, including gel polish,  artificial nails, or any other type of covering on natural nails (fingers and  toes).  Do not wear lotions, powders, or perfumes, or deodorant.  Do not shave 48 hours prior to surgery.  Men may shave face and neck.  Do not bring valuables to the hospital.  [facility] is not responsible for any belongings or valuables.  Contacts, dentures or bridgework may not be worn into surgery.  Leave your suitcase in the car.  After surgery it may be brought to your room.  For patients admitted to the hospital, discharge time will be determined by your treatment team.  Patients discharged the day of surgery will not be allowed to drive home and must  have someone with them for 24 hours.    Special instructions:   DO NOT smoke tobacco or vape for 24 hours before your procedure.  Please read over the following fact sheets that you were given. Anesthesia Post-op Instructions and Care and Recovery After Surgery      Upper Endoscopy, Adult, Care After After the procedure, it is common to have a sore throat. It is also common to have: Mild stomach pain or discomfort. Bloating. Nausea. Follow these instructions at home: The instructions below may help you care for yourself at home. Your health care provider may give you more instructions. If you have questions, ask your health care provider. If you were given a sedative during the procedure, it can affect you for several hours. Do not drive or operate machinery until your health care provider says that it is safe. If you will be going home right after the procedure, plan to have a responsible adult: Take you home from the hospital or clinic. You will not be allowed to drive. Care for you for the time you are told. Follow instructions from your health care provider about what you may eat and drink. Return to your normal activities as told by your health care provider. Ask your health care provider what activities are  safe for you. Take over-the-counter and prescription medicines only as told by your health care provider. Contact a health care provider if you: Have a sore throat that lasts longer than one day. Have trouble swallowing. Have a fever. Get help right away if you: Vomit blood or your vomit looks like coffee grounds. Have bloody, black, or tarry stools. Have a very bad sore throat or you cannot swallow. Have difficulty breathing or very bad pain in your chest or abdomen. These symptoms may be an emergency. Get help right away. Call 911. Do not wait to see if the symptoms will go away. Do not drive yourself to the hospital. Summary After the procedure, it is common to have a  sore throat, mild stomach discomfort, bloating, and nausea. If you were given a sedative during the procedure, it can affect you for several hours. Do not drive until your health care provider says that it is safe. Follow instructions from your health care provider about what you may eat and drink. Return to your normal activities as told by your health care provider. This information is not intended to replace advice given to you by your health care provider. Make sure you discuss any questions you have with your health care provider. Document Revised: 06/23/2021 Document Reviewed: 06/23/2021 Elsevier Patient Education  2024 Elsevier Inc. Colonoscopy, Adult, Care After The following information offers guidance on how to care for yourself after your procedure. Your health care provider may also give you more specific instructions. If you have problems or questions, contact your health care provider. What can I expect after the procedure? After the procedure, it is common to have: A small amount of blood in your stool for 24 hours after the procedure. Some gas. Mild cramping or bloating of your abdomen. Follow these instructions at home: Eating and drinking  Drink enough fluid to keep your urine pale yellow. Follow instructions from your health care provider about eating or drinking restrictions. Resume your normal diet as told by your health care provider. Avoid heavy or fried foods that are hard to digest. Activity Rest as told by your health care provider. Avoid sitting for a long time without moving. Get up to take short walks every 1-2 hours. This is important to improve blood flow and breathing. Ask for help if you feel weak or unsteady. Return to your normal activities as told by your health care provider. Ask your health care provider what activities are safe for you. Managing cramping and bloating  Try walking around when you have cramps or feel bloated. If directed, apply heat to  your abdomen as told by your health care provider. Use the heat source that your health care provider recommends, such as a moist heat pack or a heating pad. Place a towel between your skin and the heat source. Leave the heat on for 20-30 minutes. Remove the heat if your skin turns bright red. This is especially important if you are unable to feel pain, heat, or cold. You have a greater risk of getting burned. General instructions If you were given a sedative during the procedure, it can affect you for several hours. Do not drive or operate machinery until your health care provider says that it is safe. For the first 24 hours after the procedure: Do not sign important documents. Do not drink alcohol. Do your regular daily activities at a slower pace than normal. Eat soft foods that are easy to digest. Take over-the-counter and prescription medicines only as told  by your health care provider. Keep all follow-up visits. This is important. Contact a health care provider if: You have blood in your stool 2-3 days after the procedure. Get help right away if: You have more than a small spotting of blood in your stool. You have large blood clots in your stool. You have swelling of your abdomen. You have nausea or vomiting. You have a fever. You have increasing pain in your abdomen that is not relieved with medicine. These symptoms may be an emergency. Get help right away. Call 911. Do not wait to see if the symptoms will go away. Do not drive yourself to the hospital. Summary After the procedure, it is common to have a small amount of blood in your stool. You may also have mild cramping and bloating of your abdomen. If you were given a sedative during the procedure, it can affect you for several hours. Do not drive or operate machinery until your health care provider says that it is safe. Get help right away if you have a lot of blood in your stool, nausea or vomiting, a fever, or increased pain  in your abdomen. This information is not intended to replace advice given to you by your health care provider. Make sure you discuss any questions you have with your health care provider. Document Revised: 04/26/2022 Document Reviewed: 11/04/2020 Elsevier Patient Education  2024 Elsevier Inc. Monitored Anesthesia Care, Care After The following information offers guidance on how to care for yourself after your procedure. Your health care provider may also give you more specific instructions. If you have problems or questions, contact your health care provider. What can I expect after the procedure? After the procedure, it is common to have: Tiredness. Little or no memory about what happened during or after the procedure. Impaired judgment when it comes to making decisions. Nausea or vomiting. Some trouble with balance. Follow these instructions at home: For the time period you were told by your health care provider:  Rest. Do not participate in activities where you could fall or become injured. Do not drive or use machinery. Do not drink alcohol. Do not take sleeping pills or medicines that cause drowsiness. Do not make important decisions or sign legal documents. Do not take care of children on your own. Medicines Take over-the-counter and prescription medicines only as told by your health care provider. If you were prescribed antibiotics, take them as told by your health care provider. Do not stop using the antibiotic even if you start to feel better. Eating and drinking Follow instructions from your health care provider about what you may eat and drink. Drink enough fluid to keep your urine pale yellow. If you vomit: Drink clear fluids slowly and in small amounts as you are able. Clear fluids include water, ice chips, low-calorie sports drinks, and fruit juice that has water added to it (diluted fruit juice). Eat light and bland foods in small amounts as you are able. These foods  include bananas, applesauce, rice, lean meats, toast, and crackers. General instructions  Have a responsible adult stay with you for the time you are told. It is important to have someone help care for you until you are awake and alert. If you have sleep apnea, surgery and some medicines can increase your risk for breathing problems. Follow instructions from your health care provider about wearing your sleep device: When you are sleeping. This includes during daytime naps. While taking prescription pain medicines, sleeping medicines, or medicines that make  you drowsy. Do not use any products that contain nicotine or tobacco. These products include cigarettes, chewing tobacco, and vaping devices, such as e-cigarettes. If you need help quitting, ask your health care provider. Contact a health care provider if: You feel nauseous or vomit every time you eat or drink. You feel light-headed. You are still sleepy or having trouble with balance after 24 hours. You get a rash. You have a fever. You have redness or swelling around the IV site. Get help right away if: You have trouble breathing. You have new confusion after you get home. These symptoms may be an emergency. Get help right away. Call 911. Do not wait to see if the symptoms will go away. Do not drive yourself to the hospital. This information is not intended to replace advice given to you by your health care provider. Make sure you discuss any questions you have with your health care provider. Document Revised: 08/09/2021 Document Reviewed: 08/09/2021 Elsevier Patient Education  2024 [employer].

## 2023-03-10 ENCOUNTER — Encounter (HOSPITAL_COMMUNITY)
Admission: RE | Admit: 2023-03-10 | Discharge: 2023-03-10 | Disposition: A | Discharge status: Home / Self Care | Payer: BC Managed Care – PPO | Source: Ambulatory Visit | Attending: Internal Medicine | Admitting: Internal Medicine

## 2023-03-10 ENCOUNTER — Encounter (HOSPITAL_COMMUNITY): Payer: Self-pay

## 2023-03-10 VITALS — BP 124/87 | HR 112 | Temp 97.8°F | Resp 18 | Ht 72.0 in | Wt 235.0 lb

## 2023-03-10 DIAGNOSIS — K648 Other hemorrhoids: Secondary | ICD-10-CM | POA: Diagnosis not present

## 2023-03-10 DIAGNOSIS — Z1211 Encounter for screening for malignant neoplasm of colon: Secondary | ICD-10-CM | POA: Diagnosis not present

## 2023-03-10 DIAGNOSIS — I10 Essential (primary) hypertension: Secondary | ICD-10-CM

## 2023-03-10 DIAGNOSIS — F419 Anxiety disorder, unspecified: Secondary | ICD-10-CM | POA: Diagnosis not present

## 2023-03-10 DIAGNOSIS — Z01810 Encounter for preprocedural cardiovascular examination: Secondary | ICD-10-CM | POA: Insufficient documentation

## 2023-03-10 DIAGNOSIS — F17210 Nicotine dependence, cigarettes, uncomplicated: Secondary | ICD-10-CM | POA: Diagnosis not present

## 2023-03-10 DIAGNOSIS — K2970 Gastritis, unspecified, without bleeding: Secondary | ICD-10-CM | POA: Diagnosis not present

## 2023-03-10 DIAGNOSIS — K5140 Inflammatory polyps of colon without complications: Secondary | ICD-10-CM | POA: Diagnosis not present

## 2023-03-10 DIAGNOSIS — G4730 Sleep apnea, unspecified: Secondary | ICD-10-CM | POA: Diagnosis not present

## 2023-03-10 DIAGNOSIS — D124 Benign neoplasm of descending colon: Secondary | ICD-10-CM | POA: Diagnosis not present

## 2023-03-10 DIAGNOSIS — D125 Benign neoplasm of sigmoid colon: Secondary | ICD-10-CM | POA: Diagnosis not present

## 2023-03-10 DIAGNOSIS — E119 Type 2 diabetes mellitus without complications: Secondary | ICD-10-CM | POA: Diagnosis not present

## 2023-03-10 DIAGNOSIS — Z7984 Long term (current) use of oral hypoglycemic drugs: Secondary | ICD-10-CM | POA: Diagnosis not present

## 2023-03-10 DIAGNOSIS — F319 Bipolar disorder, unspecified: Secondary | ICD-10-CM | POA: Diagnosis not present

## 2023-03-10 HISTORY — DX: Anxiety disorder, unspecified: F41.9

## 2023-03-10 HISTORY — DX: Sleep apnea, unspecified: G47.30

## 2023-03-10 HISTORY — DX: Depression, unspecified: F32.A

## 2023-03-10 HISTORY — DX: Bipolar disorder, unspecified: F31.9

## 2023-03-13 ENCOUNTER — Encounter (HOSPITAL_COMMUNITY)
Admission: RE | Disposition: A | Discharge status: Home / Self Care | Payer: Self-pay | Source: Home / Self Care | Attending: Internal Medicine

## 2023-03-13 ENCOUNTER — Ambulatory Visit (HOSPITAL_COMMUNITY): Payer: BC Managed Care – PPO | Admitting: Anesthesiology

## 2023-03-13 ENCOUNTER — Encounter (HOSPITAL_COMMUNITY): Payer: Self-pay

## 2023-03-13 ENCOUNTER — Ambulatory Visit (HOSPITAL_COMMUNITY)
Admission: RE | Admit: 2023-03-13 | Discharge: 2023-03-13 | Disposition: A | Discharge status: Home / Self Care | Payer: BC Managed Care – PPO | Attending: Internal Medicine | Admitting: Internal Medicine

## 2023-03-13 DIAGNOSIS — E119 Type 2 diabetes mellitus without complications: Secondary | ICD-10-CM | POA: Diagnosis not present

## 2023-03-13 DIAGNOSIS — Z7984 Long term (current) use of oral hypoglycemic drugs: Secondary | ICD-10-CM | POA: Insufficient documentation

## 2023-03-13 DIAGNOSIS — K634 Enteroptosis: Secondary | ICD-10-CM

## 2023-03-13 DIAGNOSIS — K2970 Gastritis, unspecified, without bleeding: Secondary | ICD-10-CM | POA: Diagnosis not present

## 2023-03-13 DIAGNOSIS — D125 Benign neoplasm of sigmoid colon: Secondary | ICD-10-CM | POA: Insufficient documentation

## 2023-03-13 DIAGNOSIS — K3189 Other diseases of stomach and duodenum: Secondary | ICD-10-CM | POA: Diagnosis not present

## 2023-03-13 DIAGNOSIS — K2950 Unspecified chronic gastritis without bleeding: Secondary | ICD-10-CM | POA: Diagnosis not present

## 2023-03-13 DIAGNOSIS — K5140 Inflammatory polyps of colon without complications: Secondary | ICD-10-CM | POA: Diagnosis not present

## 2023-03-13 DIAGNOSIS — F319 Bipolar disorder, unspecified: Secondary | ICD-10-CM | POA: Insufficient documentation

## 2023-03-13 DIAGNOSIS — K259 Gastric ulcer, unspecified as acute or chronic, without hemorrhage or perforation: Secondary | ICD-10-CM | POA: Diagnosis not present

## 2023-03-13 DIAGNOSIS — Z139 Encounter for screening, unspecified: Secondary | ICD-10-CM | POA: Diagnosis not present

## 2023-03-13 DIAGNOSIS — F419 Anxiety disorder, unspecified: Secondary | ICD-10-CM | POA: Diagnosis not present

## 2023-03-13 DIAGNOSIS — F17210 Nicotine dependence, cigarettes, uncomplicated: Secondary | ICD-10-CM | POA: Insufficient documentation

## 2023-03-13 DIAGNOSIS — G4730 Sleep apnea, unspecified: Secondary | ICD-10-CM | POA: Insufficient documentation

## 2023-03-13 DIAGNOSIS — I10 Essential (primary) hypertension: Secondary | ICD-10-CM | POA: Diagnosis not present

## 2023-03-13 DIAGNOSIS — K648 Other hemorrhoids: Secondary | ICD-10-CM | POA: Insufficient documentation

## 2023-03-13 DIAGNOSIS — K635 Polyp of colon: Secondary | ICD-10-CM | POA: Diagnosis not present

## 2023-03-13 DIAGNOSIS — D124 Benign neoplasm of descending colon: Secondary | ICD-10-CM | POA: Insufficient documentation

## 2023-03-13 DIAGNOSIS — Z1211 Encounter for screening for malignant neoplasm of colon: Secondary | ICD-10-CM | POA: Diagnosis not present

## 2023-03-13 HISTORY — PX: BIOPSY: SHX5522

## 2023-03-13 HISTORY — PX: COLONOSCOPY WITH PROPOFOL: SHX5780

## 2023-03-13 HISTORY — PX: POLYPECTOMY: SHX149

## 2023-03-13 HISTORY — PX: ESOPHAGOGASTRODUODENOSCOPY (EGD) WITH PROPOFOL: SHX5813

## 2023-03-13 LAB — GLUCOSE, CAPILLARY: Glucose-Capillary: 112 mg/dL — ABNORMAL HIGH (ref 70–99)

## 2023-03-13 SURGERY — COLONOSCOPY WITH PROPOFOL
Anesthesia: General

## 2023-03-13 MED ORDER — PROPOFOL 10 MG/ML IV BOLUS
INTRAVENOUS | Status: DC | PRN
  Administered 2023-03-13: 100 mg via INTRAVENOUS
  Administered 2023-03-13: 50 mg via INTRAVENOUS

## 2023-03-13 MED ORDER — PANTOPRAZOLE SODIUM 40 MG PO TBEC: 40.0000 mg | DELAYED_RELEASE_TABLET | Freq: Two times a day (BID) | ORAL | 11 refills | Status: AC

## 2023-03-13 MED ORDER — LIDOCAINE HCL (CARDIAC) PF 100 MG/5ML IV SOSY
PREFILLED_SYRINGE | INTRAVENOUS | Status: DC | PRN
  Administered 2023-03-13: 60 mg via INTRAVENOUS

## 2023-03-13 MED ORDER — LACTATED RINGERS IV SOLN
INTRAVENOUS | Status: DC
Start: 2023-03-13 — End: 2023-03-13

## 2023-03-13 MED ORDER — PROPOFOL 500 MG/50ML IV EMUL
INTRAVENOUS | Status: AC
Start: 2023-03-13 — End: ?
  Filled 2023-03-13: qty 50

## 2023-03-13 MED ORDER — DEXMEDETOMIDINE HCL IN NACL 80 MCG/20ML IV SOLN
INTRAVENOUS | Status: DC | PRN
  Administered 2023-03-13: 8 ug via INTRAVENOUS

## 2023-03-13 MED ORDER — PROPOFOL 500 MG/50ML IV EMUL
INTRAVENOUS | Status: DC | PRN
  Administered 2023-03-13: 150 ug/kg/min via INTRAVENOUS

## 2023-03-13 NOTE — Transfer of Care (Addendum)
Immediate Anesthesia Transfer of Care Note  Patient: Leonard Wiggins  Procedure(s) Performed: COLONOSCOPY WITH PROPOFOL ESOPHAGOGASTRODUODENOSCOPY (EGD) WITH PROPOFOL BIOPSY POLYPECTOMY INTESTINAL  Patient Location: Short Stay  Anesthesia Type:General  Level of Consciousness: drowsy and patient cooperative  Airway & Oxygen Therapy: Patient Spontanous Breathing and Patient connected to nasal cannula oxygen  Post-op Assessment: Report given to RN and Post -op Vital signs reviewed and stable  Post vital signs: Reviewed and stable  Last Vitals:  Vitals Value Taken Time  BP 102/56 03/13/23   1313  Temp 36.7 03/13/23   1313  Pulse 84 03/13/23   1313  Resp 26 03/13/23   1313  SpO2 96% 03/13/23   1313    Last Pain:  Vitals:   03/13/23 1239  TempSrc:   PainSc: 0-No pain      Patients Stated Pain Goal: 6 (03/13/23 1121)  Complications: No notable events documented.

## 2023-03-13 NOTE — Op Note (Signed)
[facility] Patient Name: Leonard Wiggins Procedure Date: 03/13/2023 11:52 AM MRN: [id number] Date of Birth: 1976/01/06 Attending MD: Hennie Duos. Marletta Lor , [place], [id number] CSN: [id number] Age: 47 Admit Type: Outpatient Procedure:                Colonoscopy Indications:              Screening for colorectal malignant neoplasm Providers:                Hennie Duos. Marletta Lor, DO, Crystal Page, Lennice Sites                            Technician, Technician Referring MD:              Medicines:                See the Anesthesia note for documentation of the                            administered medications Complications:            No immediate complications. Estimated Blood Loss:     Estimated blood loss was minimal. Procedure:                Pre-Anesthesia Assessment:                           - The anesthesia plan was to use monitored                            anesthesia care (MAC).                           After obtaining informed consent, the colonoscope                            was passed under direct vision. Throughout the                            procedure, the patient's blood pressure, pulse, and                            oxygen saturations were monitored continuously. The                            PCF-HQ190L ([id number]) scope was introduced through                            the anus and advanced to the the cecum, identified                            by appendiceal orifice and ileocecal valve. The                            colonoscopy was performed without difficulty. The                            patient tolerated the procedure well.  The quality                            of the bowel preparation was evaluated using the                            BBPS [facility] Bowel Preparation Scale) with scores                            of: Right Colon = 2 (minor amount of residual                            staining, small fragments of stool and/or opaque                             liquid, but mucosa seen well), Transverse Colon = 2                            (minor amount of residual staining, small fragments                            of stool and/or opaque liquid, but mucosa seen                            well) and Left Colon = 2 (minor amount of residual                            staining, small fragments of stool and/or opaque                            liquid, but mucosa seen well). The total BBPS score                            equals 6. The quality of the bowel preparation was                            good. Scope In: 12:50:04 PM Scope Out: 1:07:19 PM Scope Withdrawal Time: 0 hours 14 minutes 15 seconds  Total Procedure Duration: 0 hours 17 minutes 15 seconds  Findings:      Non-bleeding internal hemorrhoids were found during endoscopy.      A 5 mm polyp was found in the descending colon. The polyp was sessile.       The polyp was removed with a cold snare. Resection and retrieval were       complete.      A 6 mm polyp was found in the sigmoid colon. The polyp was pedunculated.       The polyp was removed with a cold snare. Resection and retrieval were       complete.      A moderate amount of semi-liquid stool was found in the transverse       colon, in the ascending colon and in the cecum, making visualization       difficult. Lavage of the area was performed using copious amounts of  sterile water, resulting in clearance with good visualization. Impression:               - Non-bleeding internal hemorrhoids.                           - One 5 mm polyp in the descending colon, removed                            with a cold snare. Resected and retrieved.                           - One 6 mm polyp in the sigmoid colon, removed with                            a cold snare. Resected and retrieved.                           - Stool in the transverse colon, in the ascending                            colon and in the cecum. Moderate Sedation:      Per  Anesthesia Care Recommendation:           - Patient has a contact number available for                            emergencies. The signs and symptoms of potential                            delayed complications were discussed with the                            patient. Return to normal activities tomorrow.                            Written discharge instructions were provided to the                            patient.                           - Resume previous diet.                           - Continue present medications.                           - Await pathology results.                           - Repeat colonoscopy in 5 years for surveillance.                           - Return to GI clinic in 3 months. Procedure Code(s):        --- Professional ---  40981, Colonoscopy, flexible; with removal of                            tumor(s), polyp(s), or other lesion(s) by snare                            technique Diagnosis Code(s):        --- Professional ---                           Z12.11, Encounter for screening for malignant                            neoplasm of colon                           D12.4, Benign neoplasm of descending colon                           D12.5, Benign neoplasm of sigmoid colon                           K64.8, Other hemorrhoids CPT copyright 2022 American [employer] Association. All rights reserved. The codes documented in this report are preliminary and upon coder review may  be revised to meet current compliance requirements. Hennie Duos. Marletta Lor, DO Hennie Duos. Marletta Lor, DO 03/13/2023 1:09:49 PM This report has been signed electronically. Number of Addenda: 0

## 2023-03-13 NOTE — Discharge Instructions (Addendum)
EGD Discharge instructions Please read the instructions outlined below and refer to this sheet in the next few weeks. These discharge instructions provide you with general information on caring for yourself after you leave the hospital. Your doctor may also give you specific instructions. While your treatment has been planned according to the most current medical practices available, unavoidable complications occasionally occur. If you have any problems or questions after discharge, please call your doctor. ACTIVITY You may resume your regular activity but move at a slower pace for the next 24 hours.  Take frequent rest periods for the next 24 hours.  Walking will help expel (get rid of) the air and reduce the bloated feeling in your abdomen.  No driving for 24 hours (because of the anesthesia (medicine) used during the test).  You may shower.  Do not sign any important legal documents or operate any machinery for 24 hours (because of the anesthesia used during the test).  NUTRITION Drink plenty of fluids.  You may resume your normal diet.  Begin with a light meal and progress to your normal diet.  Avoid alcoholic beverages for 24 hours or as instructed by your caregiver.  MEDICATIONS You may resume your normal medications unless your caregiver tells you otherwise.  WHAT YOU CAN EXPECT TODAY You may experience abdominal discomfort such as a feeling of fullness or "gas" pains.  FOLLOW-UP Your doctor will discuss the results of your test with you.  SEEK IMMEDIATE MEDICAL ATTENTION IF ANY OF THE FOLLOWING OCCUR: Excessive nausea (feeling sick to your stomach) and/or vomiting.  Severe abdominal pain and distention (swelling).  Trouble swallowing.  Temperature over 101 F (37.8 C).  Rectal bleeding or vomiting of blood.      Colonoscopy Discharge Instructions  Read the instructions outlined below and refer to this sheet in the next few weeks. These discharge instructions provide you  with general information on caring for yourself after you leave the hospital. Your doctor may also give you specific instructions. While your treatment has been planned according to the most current medical practices available, unavoidable complications occasionally occur.   ACTIVITY You may resume your regular activity, but move at a slower pace for the next 24 hours.  Take frequent rest periods for the next 24 hours.  Walking will help get rid of the air and reduce the bloated feeling in your belly (abdomen).  No driving for 24 hours (because of the medicine (anesthesia) used during the test).   Do not sign any important legal documents or operate any machinery for 24 hours (because of the anesthesia used during the test).  NUTRITION Drink plenty of fluids.  You may resume your normal diet as instructed by your doctor.  Begin with a light meal and progress to your normal diet. Heavy or fried foods are harder to digest and may make you feel sick to your stomach (nauseated).  Avoid alcoholic beverages for 24 hours or as instructed.  MEDICATIONS You may resume your normal medications unless your doctor tells you otherwise.  WHAT YOU CAN EXPECT TODAY Some feelings of bloating in the abdomen.  Passage of more gas than usual.  Spotting of blood in your stool or on the toilet paper.  IF YOU HAD POLYPS REMOVED DURING THE COLONOSCOPY: No aspirin products for 7 days or as instructed.  No alcohol for 7 days or as instructed.  Eat a soft diet for the next 24 hours.  FINDING OUT THE RESULTS OF YOUR TEST Not all test results  are available during your visit. If your test results are not back during the visit, make an appointment with your caregiver to find out the results. Do not assume everything is normal if you have not heard from your caregiver or the medical facility. It is important for you to follow up on all of your test results.  SEEK IMMEDIATE MEDICAL ATTENTION IF: You have more than a  spotting of blood in your stool.  Your belly is swollen (abdominal distention).  You are nauseated or vomiting.  You have a temperature over 101.  You have abdominal pain or discomfort that is severe or gets worse throughout the day.   Your upper endoscopy revealed moderate amount of inflammation in your stomach with multiple ulcers.  I took samples to rule out infection with a bacteria called H. pylori.  I am going to increase your pantoprazole to twice daily for the next 12 weeks.  Esophagus and small bowel appeared normal.  Your colonoscopy revealed 2 polyp(s) which I removed successfully. Await pathology results, my office will contact you. I recommend repeating colonoscopy in 5 years for surveillance purposes.   Follow up in GI office in 3 months.   I hope you have a great rest of your week!  Hennie Duos. Marletta Lor, D.O. Gastroenterology and Hepatology [facility]

## 2023-03-13 NOTE — H&P (Signed)
Primary Care Physician:  Roe Rutherford, NP Primary Gastroenterologist:  Dr. Marletta Lor  Pre-Procedure History & Physical: HPI:  Leonard Wiggins is a 47 y.o. male is here for an upper endoscopy for Epigastric pain,. Nausea/vomiting and a colonoscopy to be performed for colon cancer screening purposes.  Past Medical History:  Diagnosis Date   Anxiety    Bipolar disorder (HCC)    Depression    Diabetes mellitus without complication (HCC)    Hypertension    Migraines    Sleep apnea     Past Surgical History:  Procedure Laterality Date   MULTIPLE TOOTH EXTRACTIONS      Prior to Admission medications   Medication Sig Start Date End Date Taking? Authorizing Provider  busPIRone (BUSPAR) 30 MG tablet Take 30 mg by mouth 2 (two) times daily.   Yes [provider]  citalopram (CELEXA) 40 MG tablet Take 40 mg by mouth daily.   Yes [provider]  clonazePAM (KLONOPIN) 1 MG tablet Take 1 mg by mouth 2 (two) times daily.   Yes [provider]  gabapentin (NEURONTIN) 400 MG capsule Take 400 mg by mouth 3 (three) times daily.   Yes [provider]  lamoTRIgine (LAMICTAL) 150 MG tablet Take 150 mg by mouth daily.   Yes [provider]  lisinopril (ZESTRIL) 5 MG tablet Take 5 mg by mouth daily.   Yes [provider]  ondansetron (ZOFRAN-ODT) 4 MG disintegrating tablet 4mg  ODT q4 hours prn nausea/vomit 08/13/21  Yes Pollina, Canary Brim, MD  pantoprazole (PROTONIX) 40 MG tablet Take 1 tablet (40 mg total) by mouth daily. 02/06/23  Yes Mahon, Courtney L, NP  rOPINIRole (REQUIP) 1 MG tablet Take by mouth.   Yes [provider]  Sodium Sulfate-Mag Sulfate-KCl (SUTAB) [phone] MG TABS Take 12 tablets by mouth as directed. 03/01/23  Yes Wynton Hufstetler K, DO  sucralfate (CARAFATE) 1 GM/10ML suspension Take 10 mLs (1 g total) by mouth 4 (four) times daily -  with meals and at bedtime. 08/13/21  Yes Pollina, Canary Brim, MD  cloNIDine  (CATAPRES) 0.2 MG tablet Take 0.2 mg by mouth 2 (two) times daily. Patient not taking: Reported on 02/06/2023 09/02/21   [provider]  glipiZIDE (GLUCOTROL XL) 2.5 MG 24 hr tablet Take 2.5 mg by mouth daily with breakfast. Patient not taking: Reported on 02/06/2023    [provider]  loperamide (IMODIUM) 2 MG capsule Take 1 capsule (2 mg total) by mouth 4 (four) times daily as needed for diarrhea or loose stools. Patient not taking: Reported on 10/03/2021 08/13/21   Gilda Crease, MD    Allergies as of 02/06/2023   (No Known Allergies)    Family History  Problem Relation Age of Onset   Bladder Cancer Father    Colon polyps Neg Hx    Cancer - Colon Neg Hx     Social History   Socioeconomic History   Marital status: Married    Spouse name: Not on file   Number of children: Not on file   Years of education: Not on file   Highest education level: Not on file  Occupational History   Not on file  Tobacco Use   Smoking status: Every Day    Current packs/day: 1.00    Types: Cigarettes   Smokeless tobacco: Never  Substance and Sexual Activity   Alcohol use: Not Currently   Drug use: Not Currently   Sexual activity: Not on file  Other Topics Concern  Not on file  Social History Narrative   Not on file   Social Drivers of Health   Financial Resource Strain: Low Risk  (01/20/2023)   Received from [facility]   Overall Financial Resource Strain (CARDIA)    Difficulty of Paying Living Expenses: Not hard at all  Food Insecurity: No Food Insecurity (01/20/2023)   Received from [facility]   Hunger Vital Sign    Worried About Running Out of Food in the Last Year: Never true    Ran Out of Food in the Last Year: Never true  Transportation Needs: No Transportation Needs (01/20/2023)   Received from [facility] - Transportation    Lack of Transportation (Medical): No    Lack of Transportation (Non-Medical): No  Physical Activity: Not on  file  Stress: Not on file  Social Connections: Unknown (01/18/2023)   Received from [facility]   Social Network    Social Network: Not on file  Intimate Partner Violence: Unknown (01/18/2023)   Received from [facility]   HITS    Physically Hurt: Not on file    Insult or Talk Down To: Not on file    Threaten Physical Harm: Not on file    Scream or Curse: Not on file    Review of Systems: See HPI, otherwise negative ROS  Physical Exam: Vital signs in last 24 hours: Temp:  [97.7 F (36.5 C)] 97.7 F (36.5 C) (12/16 1121) Pulse Rate:  [110] 110 (12/16 1121) Resp:  [18] 18 (12/16 1121) BP: (130)/(80) 130/80 (12/16 1121) SpO2:  [94 %] 94 % (12/16 1121)   General:   Alert,  Well-developed, well-nourished, pleasant and cooperative in NAD Head:  Normocephalic and atraumatic. Eyes:  Sclera clear, no icterus.   Conjunctiva pink. Ears:  Normal auditory acuity. Nose:  No deformity, discharge,  or lesions. Msk:  Symmetrical without gross deformities. Normal posture. Extremities:  Without clubbing or edema. Neurologic:  Alert and  oriented x4;  grossly normal neurologically. Skin:  Intact without significant lesions or rashes. Psych:  Alert and cooperative. Normal mood and affect.  Impression/Plan: Leonard Wiggins is here for an upper endoscopy for Epigastric pain,. Nausea/vomiting and a colonoscopy to be performed for colon cancer screening purposes.  The risks of the procedure including infection, bleed, or perforation as well as benefits, limitations, alternatives and imponderables have been reviewed with the patient. Questions have been answered. All parties agreeable.

## 2023-03-13 NOTE — Anesthesia Preprocedure Evaluation (Signed)
Anesthesia Evaluation  Patient identified by MRN, date of birth, ID band Patient awake    Reviewed: Allergy & Precautions, H&P , NPO status , Patient's Chart, lab work & pertinent test results, reviewed documented beta blocker date and time   Airway Mallampati: II  TM Distance: >3 FB Neck ROM: full    Dental no notable dental hx.    Pulmonary sleep apnea , Current Smoker   Pulmonary exam normal breath sounds clear to auscultation       Cardiovascular Exercise Tolerance: Good hypertension,  Rhythm:regular Rate:Normal     Neuro/Psych  Headaches PSYCHIATRIC DISORDERS Anxiety Depression Bipolar Disorder      GI/Hepatic negative GI ROS, Neg liver ROS,,,  Endo/Other  diabetes    Renal/GU negative Renal ROS  negative genitourinary   Musculoskeletal   Abdominal   Peds  Hematology negative hematology ROS (+)   Anesthesia Other Findings   Reproductive/Obstetrics negative OB ROS                             Anesthesia Physical Anesthesia Plan  ASA: 2  Anesthesia Plan: General   Post-op Pain Management:    Induction:   PONV Risk Score and Plan: Propofol infusion  Airway Management Planned:   Additional Equipment:   Intra-op Plan:   Post-operative Plan:   Informed Consent: I have reviewed the patients History and Physical, chart, labs and discussed the procedure including the risks, benefits and alternatives for the proposed anesthesia with the patient or authorized representative who has indicated his/her understanding and acceptance.     Dental Advisory Given  Plan Discussed with: CRNA  Anesthesia Plan Comments:        Anesthesia Quick Evaluation

## 2023-03-13 NOTE — Op Note (Signed)
[facility] Patient Name: Leonard Wiggins Procedure Date: 03/13/2023 11:54 AM MRN: [id number] Date of Birth: [birth year] Attending MD: Hennie Duos. Marletta Lor , [place], [id number] CSN: [id number] Age: 47 Admit Type: Outpatient Procedure:                Upper GI endoscopy Indications:              Epigastric abdominal pain, Nausea with vomiting Providers:                Hennie Duos. Marletta Lor, DO, Crystal Page, Lennice Sites                            Technician, Technician Referring MD:              Medicines:                See the Anesthesia note for documentation of the                            administered medications Complications:            No immediate complications. Estimated Blood Loss:     Estimated blood loss was minimal. Procedure:                Pre-Anesthesia Assessment:                           - The anesthesia plan was to use monitored                            anesthesia care (MAC).                           After obtaining informed consent, the endoscope was                            passed under direct vision. Throughout the                            procedure, the patient's blood pressure, pulse, and                            oxygen saturations were monitored continuously. The                            GIF-H190 ([id number]) scope was introduced through the                            mouth, and advanced to the second part of duodenum.                            The upper GI endoscopy was accomplished without                            difficulty. The patient tolerated the procedure                            well.  Scope In: 12:43:06 PM Scope Out: 12:46:25 PM Total Procedure Duration: 0 hours 3 minutes 19 seconds  Findings:      The Z-line was regular and was found 38 cm from the incisors.      Patchy moderate inflammation characterized by erosions, erythema and       shallow ulcerations was found in the gastric body. Biopsies were taken       with a cold forceps for  Helicobacter pylori testing.      The duodenal bulb, first portion of the duodenum and second portion of       the duodenum were normal. Impression:               - Z-line regular, 38 cm from the incisors.                           - Gastritis. Biopsied.                           - Normal duodenal bulb, first portion of the                            duodenum and second portion of the duodenum. Moderate Sedation:      Per Anesthesia Care Recommendation:           - Patient has a contact number available for                            emergencies. The signs and symptoms of potential                            delayed complications were discussed with the                            patient. Return to normal activities tomorrow.                            Written discharge instructions were provided to the                            patient.                           - Resume previous diet.                           - Continue present medications.                           - Await pathology results.                           - Use a proton pump inhibitor PO BID.                           - No ibuprofen, naproxen, or other non-steroidal  anti-inflammatory drugs.                           - Return to GI clinic in 3 months. Procedure Code(s):        --- Professional ---                           [phone], Esophagogastroduodenoscopy, flexible,                            transoral; with biopsy, single or multiple Diagnosis Code(s):        --- Professional ---                           K29.70, Gastritis, unspecified, without bleeding                           R10.13, Epigastric pain                           R11.2, Nausea with vomiting, unspecified CPT copyright 2022 American Medical Association. All rights reserved. The codes documented in this report are preliminary and upon coder review may  be revised to meet current compliance requirements. Hennie Duos. Marletta Lor,  DO Hennie Duos. Marletta Lor, DO 03/13/2023 12:48:29 PM This report has been signed electronically. Number of Addenda: 0

## 2023-03-15 LAB — SURGICAL PATHOLOGY

## 2023-03-21 NOTE — Anesthesia Postprocedure Evaluation (Signed)
Anesthesia Post Note  Patient: RAAD BAJEMA  Procedure(s) Performed: COLONOSCOPY WITH PROPOFOL ESOPHAGOGASTRODUODENOSCOPY (EGD) WITH PROPOFOL BIOPSY POLYPECTOMY INTESTINAL  Patient location during evaluation: Phase II Anesthesia Type: General Level of consciousness: awake Pain management: pain level controlled Vital Signs Assessment: post-procedure vital signs reviewed and stable Respiratory status: spontaneous breathing and respiratory function stable Cardiovascular status: blood pressure returned to baseline and stable Postop Assessment: no headache and no apparent nausea or vomiting Anesthetic complications: no Comments: Late entry   No notable events documented.   Last Vitals:  Vitals:   03/13/23 1121 03/13/23 1313  BP: 130/80 (!) 102/56  Pulse: (!) 110 85  Resp: 18 (!) 26  Temp: 36.5 C 36.7 C  SpO2: 94% 96%    Last Pain:  Vitals:   03/14/23 1307  TempSrc:   PainSc: 0-No pain                 Windell Norfolk

## 2023-03-30 ENCOUNTER — Encounter (HOSPITAL_COMMUNITY): Payer: Self-pay | Admitting: Internal Medicine

## 2023-04-21 DIAGNOSIS — Z1330 Encounter for screening examination for mental health and behavioral disorders, unspecified: Secondary | ICD-10-CM | POA: Diagnosis not present

## 2023-04-21 DIAGNOSIS — F5105 Insomnia due to other mental disorder: Secondary | ICD-10-CM | POA: Diagnosis not present

## 2023-04-21 DIAGNOSIS — R1013 Epigastric pain: Secondary | ICD-10-CM | POA: Diagnosis not present

## 2023-04-21 DIAGNOSIS — G2581 Restless legs syndrome: Secondary | ICD-10-CM | POA: Diagnosis not present

## 2023-04-21 DIAGNOSIS — G5793 Unspecified mononeuropathy of bilateral lower limbs: Secondary | ICD-10-CM | POA: Diagnosis not present

## 2023-05-18 DIAGNOSIS — F410 Panic disorder [episodic paroxysmal anxiety] without agoraphobia: Secondary | ICD-10-CM | POA: Diagnosis not present

## 2023-05-18 DIAGNOSIS — F411 Generalized anxiety disorder: Secondary | ICD-10-CM | POA: Diagnosis not present

## 2023-05-18 DIAGNOSIS — F3181 Bipolar II disorder: Secondary | ICD-10-CM | POA: Diagnosis not present

## 2023-05-22 ENCOUNTER — Encounter: Payer: Self-pay | Admitting: Internal Medicine

## 2023-07-12 DIAGNOSIS — Z79899 Other long term (current) drug therapy: Secondary | ICD-10-CM | POA: Diagnosis not present

## 2023-07-12 DIAGNOSIS — M79674 Pain in right toe(s): Secondary | ICD-10-CM | POA: Diagnosis not present

## 2023-07-12 DIAGNOSIS — E1165 Type 2 diabetes mellitus with hyperglycemia: Secondary | ICD-10-CM | POA: Diagnosis not present

## 2023-07-12 DIAGNOSIS — F5105 Insomnia due to other mental disorder: Secondary | ICD-10-CM | POA: Diagnosis not present

## 2023-07-27 DIAGNOSIS — F411 Generalized anxiety disorder: Secondary | ICD-10-CM | POA: Diagnosis not present

## 2023-07-27 DIAGNOSIS — F3181 Bipolar II disorder: Secondary | ICD-10-CM | POA: Diagnosis not present

## 2023-07-27 DIAGNOSIS — F410 Panic disorder [episodic paroxysmal anxiety] without agoraphobia: Secondary | ICD-10-CM | POA: Diagnosis not present

## 2023-09-24 IMAGING — CT CT ABD-PELV W/ CM
3 of 5 series · 16 of 46 positions shown, 18 images · IV contrast (Omnipaque or Isovue)
Comparison: None Available.

CLINICAL DATA: Abdominal pain, nausea/vomiting

EXAM:
CT ABDOMEN AND PELVIS WITH CONTRAST
TECHNIQUE: Multidetector CT imaging of the abdomen and pelvis was performed
using the standard protocol following bolus administration of
intravenous contrast.

[Series 2: axial st · axial · 0.71mm/px · z∈[+978,+1378]mm · 11 of 98 slices shown, 13 images]
[im 9/98  soft-tissue]
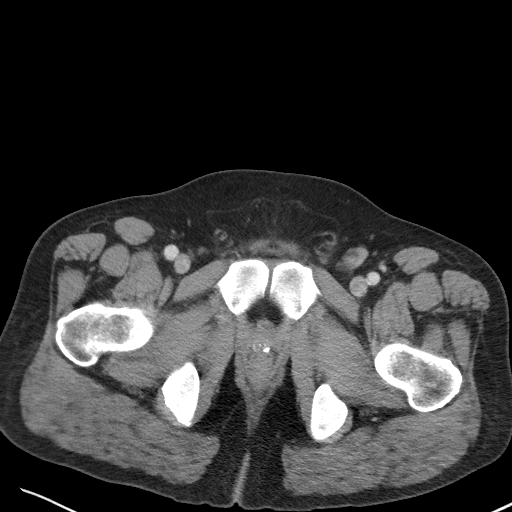
[im 9/98  bone]
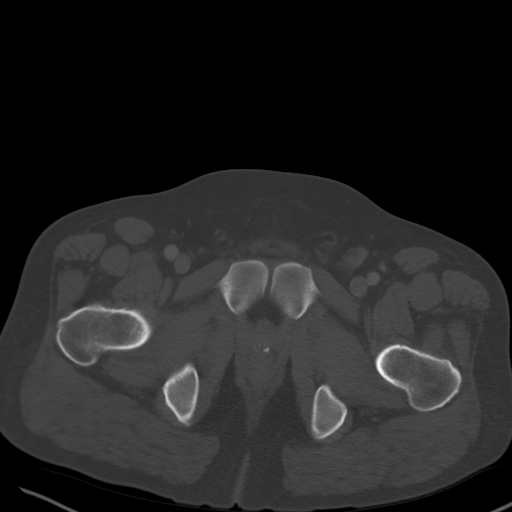
[im 17/98  soft-tissue]
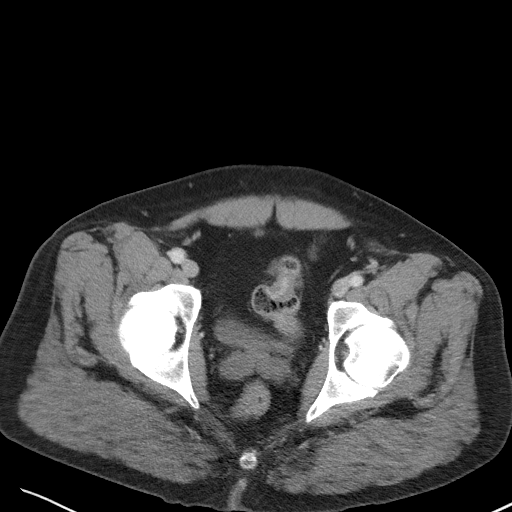
[im 25/98  soft-tissue]
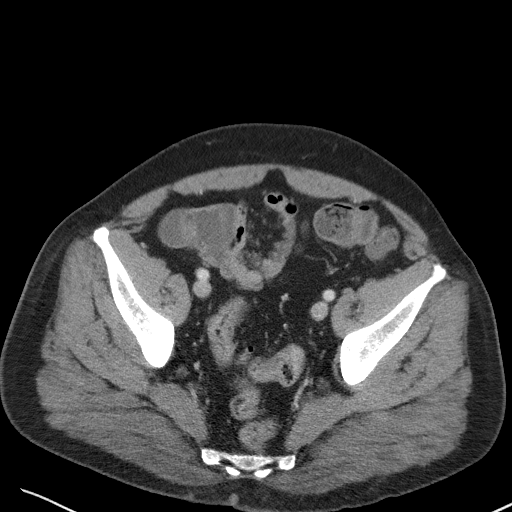
[im 33/98  soft-tissue]
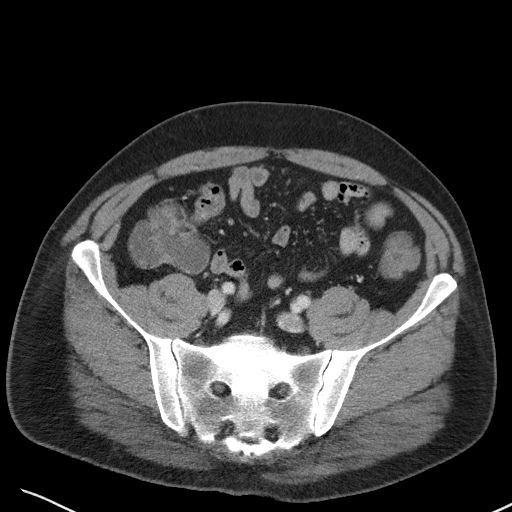
[im 41/98  soft-tissue]
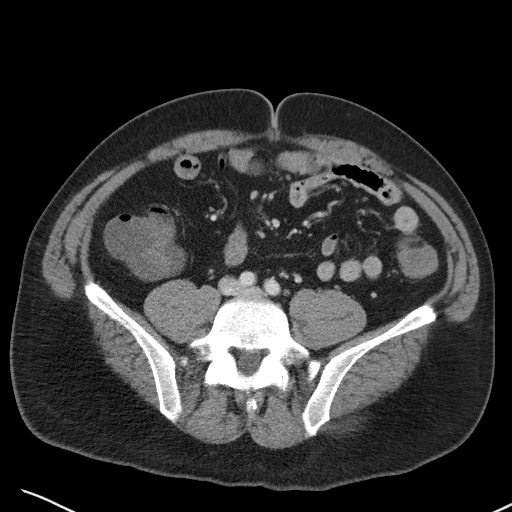
[im 49/98  soft-tissue]
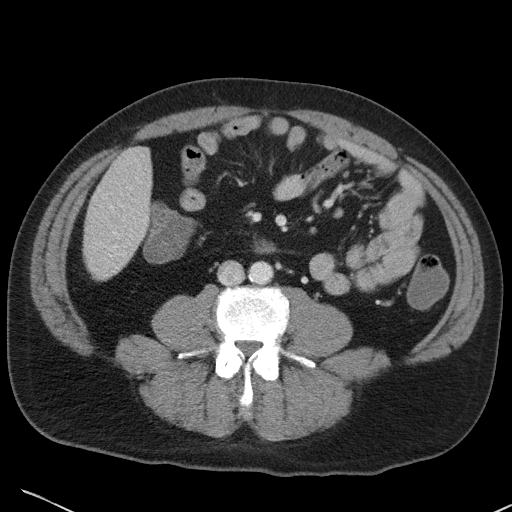
[im 57/98  soft-tissue]
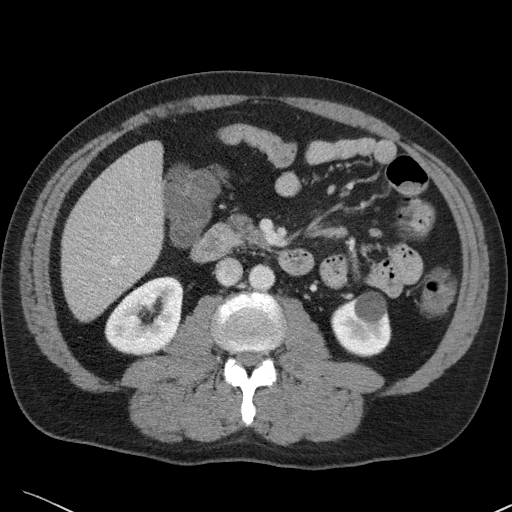
[im 65/98  soft-tissue]
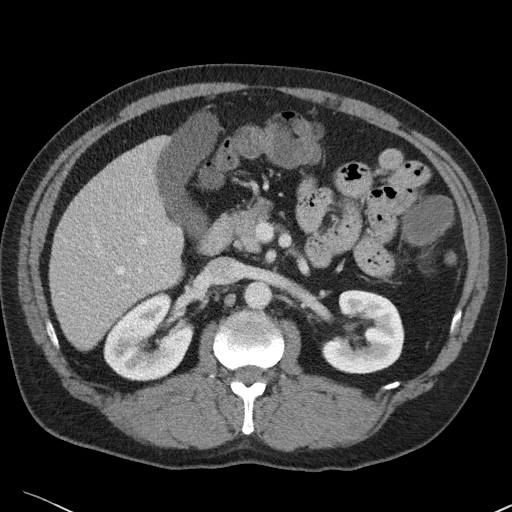
[im 73/98  soft-tissue]
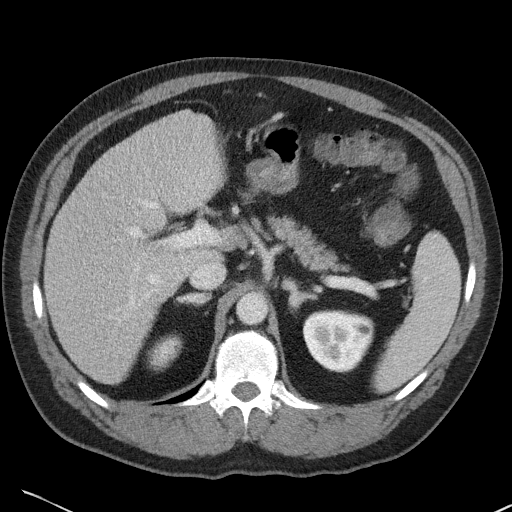
[im 73/98  bone]
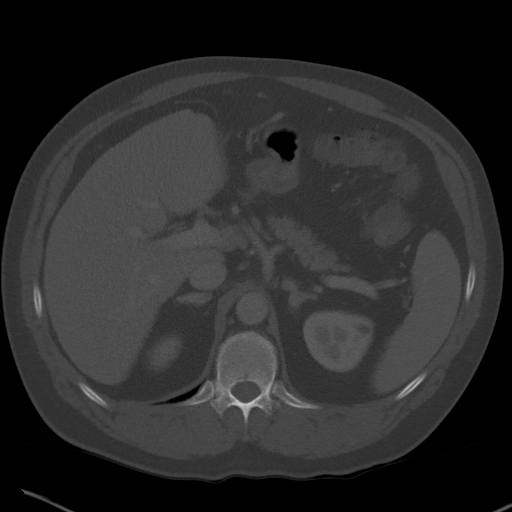
[im 81/98  soft-tissue]
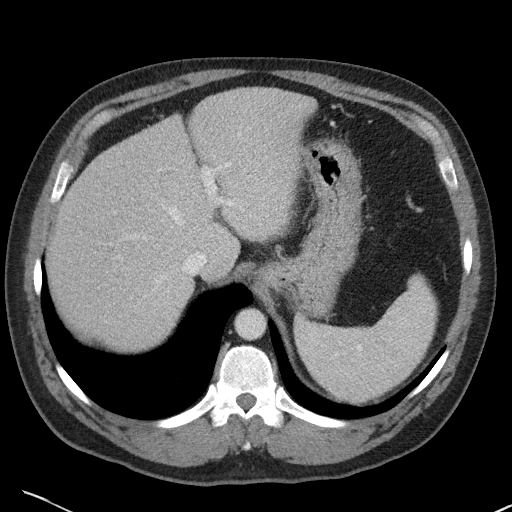
[im 89/98  soft-tissue]
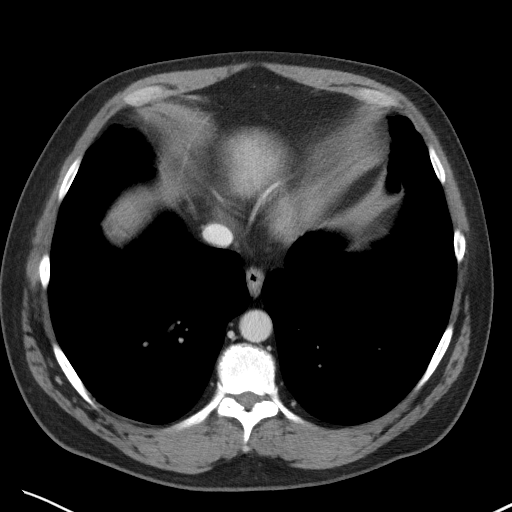

[Series 4: lung bases · axial · 0.71mm/px · z∈[+973,+1048]mm · 2 of 98 slices shown]
[im 8/98  bone]
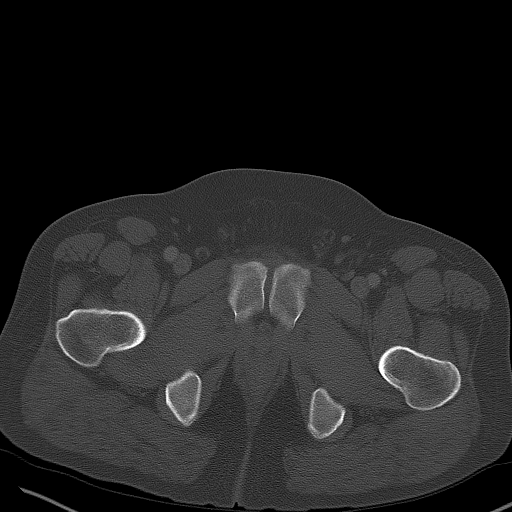
[im 23/98  bone]
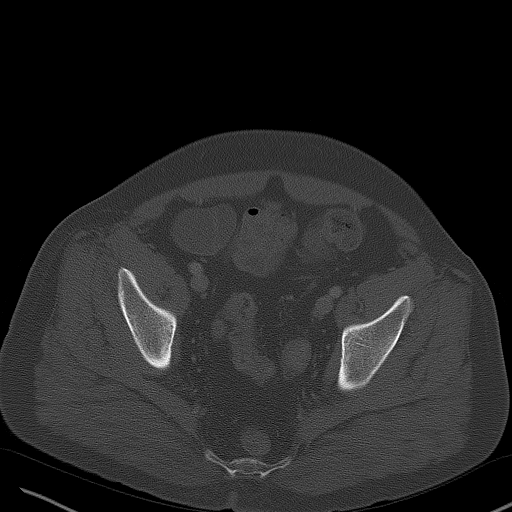

[Series 5: coronal st · coronal · 0.76mm/px · 3 of 92 slices shown]
[im 31/92  soft-tissue]
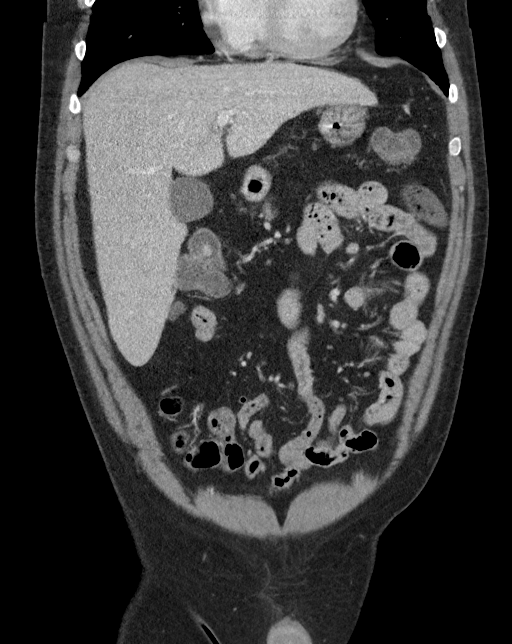
[im 41/92  soft-tissue]
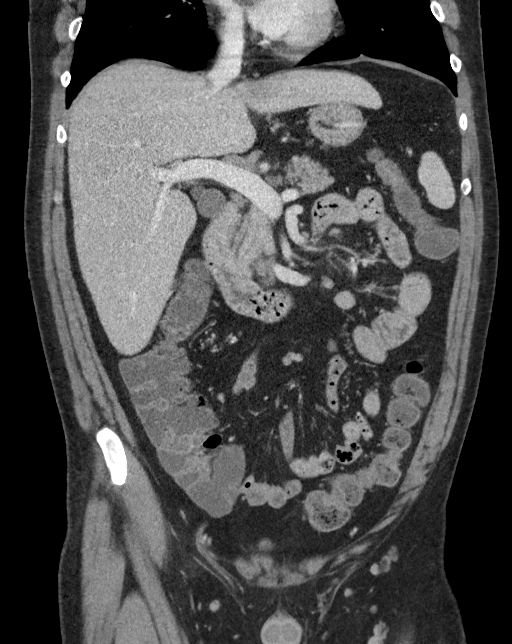
[im 51/92  soft-tissue]
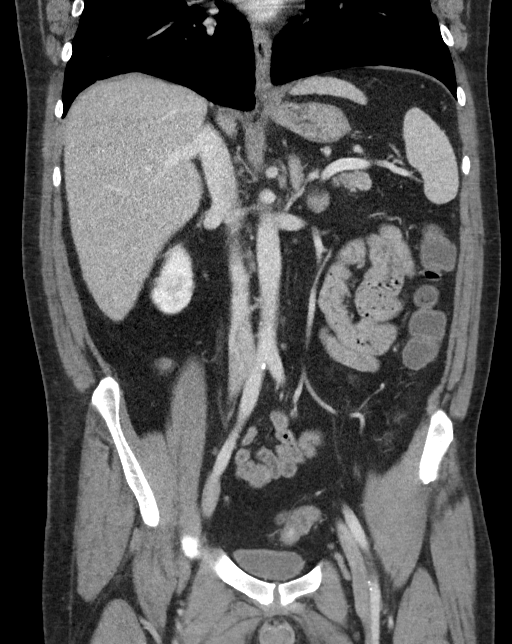

[16 of 46 positions shown; findings below may reference images not displayed]

RADIATION DOSE REDUCTION: This exam was performed according to the
departmental dose-optimization program which includes automated
exposure control, adjustment of the mA and/or kV according to
patient size and/or use of iterative reconstruction technique.

CONTRAST:  100mL OMNIPAQUE IOHEXOL 300 MG/ML  SOLN
FINDINGS: Lower chest: Lung bases are clear.

Hepatobiliary: 14 mm cyst versus hemangioma in the posterior right
hepatic lobe (series 2/image 28). Additional subcentimeter probable
cyst (series 2/image 47).

Gallbladder is unremarkable. No intrahepatic or extrahepatic ductal
dilatation.

Pancreas: Within normal limits.

Spleen: Within normal limits.

Adrenals/Urinary Tract: Subcentimeter right adrenal cyst (series
2/image 23), benign. No follow-up imaging is recommended. Left
adrenal gland is within normal limits.

2.4 cm left lower pole renal cyst, benign. Right kidney is within
normal limits. No hydronephrosis.

Bladder is underdistended but unremarkable.

Stomach/Bowel: Stomach is within normal limits.

No evidence of bowel obstruction.

Normal appendix (series 2/image 62).

No colonic wall thickening or inflammatory changes.

Vascular/Lymphatic: No evidence of abdominal aortic aneurysm.

Mild atherosclerotic calcifications the abdominal aorta.

No suspicious abdominopelvic lymphadenopathy.

Reproductive: Prostate is unremarkable, noting dystrophic
calcifications.

Other: No abdominopelvic ascites.

Musculoskeletal: Visualized osseous structures are within normal
limits.
IMPRESSION: No evidence of bowel obstruction. Normal appendix. No CT findings to
account for the patient's abdominal pain.

Additional ancillary findings as above.
